# Patient Record
Sex: Male | Born: 1955
Health system: Southern US, Community
[De-identification: ages and names within clinical notes are randomized; demographics above are authoritative.]

## PROBLEM LIST (undated history)

## (undated) DIAGNOSIS — F329 Major depressive disorder, single episode, unspecified: Secondary | ICD-10-CM

## (undated) DIAGNOSIS — B1920 Unspecified viral hepatitis C without hepatic coma: Secondary | ICD-10-CM

## (undated) DIAGNOSIS — I10 Essential (primary) hypertension: Secondary | ICD-10-CM

## (undated) DIAGNOSIS — F1910 Other psychoactive substance abuse, uncomplicated: Secondary | ICD-10-CM

## (undated) DIAGNOSIS — F32A Depression, unspecified: Secondary | ICD-10-CM

## (undated) DIAGNOSIS — F419 Anxiety disorder, unspecified: Secondary | ICD-10-CM

## (undated) HISTORY — DX: Essential (primary) hypertension: I10

## (undated) HISTORY — DX: Unspecified viral hepatitis C without hepatic coma: B19.20

## (undated) HISTORY — DX: Other psychoactive substance abuse, uncomplicated: F19.10

## (undated) HISTORY — PX: APPENDECTOMY: SHX54

---

## 2018-02-11 ENCOUNTER — Emergency Department (HOSPITAL_COMMUNITY): Payer: Self-pay

## 2018-02-11 ENCOUNTER — Encounter (HOSPITAL_COMMUNITY): Payer: Self-pay | Admitting: Emergency Medicine

## 2018-02-11 ENCOUNTER — Emergency Department (HOSPITAL_COMMUNITY)
Admission: EM | Admit: 2018-02-11 | Discharge: 2018-02-11 | Disposition: A | Discharge status: Home / Self Care | Payer: Self-pay | Attending: Emergency Medicine | Admitting: Emergency Medicine

## 2018-02-11 DIAGNOSIS — R251 Tremor, unspecified: Secondary | ICD-10-CM | POA: Insufficient documentation

## 2018-02-11 DIAGNOSIS — G252 Other specified forms of tremor: Secondary | ICD-10-CM

## 2018-02-11 DIAGNOSIS — E871 Hypo-osmolality and hyponatremia: Secondary | ICD-10-CM | POA: Insufficient documentation

## 2018-02-11 DIAGNOSIS — Z79899 Other long term (current) drug therapy: Secondary | ICD-10-CM | POA: Insufficient documentation

## 2018-02-11 DIAGNOSIS — I951 Orthostatic hypotension: Secondary | ICD-10-CM | POA: Insufficient documentation

## 2018-02-11 HISTORY — DX: Depression, unspecified: F32.A

## 2018-02-11 HISTORY — DX: Anxiety disorder, unspecified: F41.9

## 2018-02-11 HISTORY — DX: Major depressive disorder, single episode, unspecified: F32.9

## 2018-02-11 LAB — URINALYSIS, ROUTINE W REFLEX MICROSCOPIC
BILIRUBIN URINE: NEGATIVE
Glucose, UA: NEGATIVE mg/dL
Hgb urine dipstick: NEGATIVE
Ketones, ur: NEGATIVE mg/dL
Nitrite: NEGATIVE
Protein, ur: NEGATIVE mg/dL
Specific Gravity, Urine: 1.006 (ref 1.005–1.030)
pH: 7 (ref 5.0–8.0)

## 2018-02-11 LAB — COMPREHENSIVE METABOLIC PANEL
ALT: 84 U/L — ABNORMAL HIGH (ref 0–44)
AST: 66 U/L — AB (ref 15–41)
Albumin: 3.7 g/dL (ref 3.5–5.0)
Alkaline Phosphatase: 76 U/L (ref 38–126)
Anion gap: 9 (ref 5–15)
BUN: 9 mg/dL (ref 8–23)
CO2: 24 mmol/L (ref 22–32)
Calcium: 9 mg/dL (ref 8.9–10.3)
Chloride: 98 mmol/L (ref 98–111)
Creatinine, Ser: 0.8 mg/dL (ref 0.61–1.24)
GFR calc Af Amer: >60 mL/min (ref >60)
GFR calc non Af Amer: >60 mL/min (ref >60)
Glucose, Bld: 106 mg/dL — ABNORMAL HIGH (ref 70–99)
Potassium: 4.4 mmol/L (ref 3.5–5.1)
Sodium: 131 mmol/L — ABNORMAL LOW (ref 135–145)
Total Bilirubin: 0.7 mg/dL (ref 0.3–1.2)
Total Protein: 6.9 g/dL (ref 6.5–8.1)

## 2018-02-11 LAB — CBC WITH DIFFERENTIAL/PLATELET
Abs Immature Granulocytes: 0.02 10*3/uL (ref 0.00–0.07)
BASOS ABS: 0 10*3/uL (ref 0.0–0.1)
Basophils Relative: 1 %
Eosinophils Absolute: 0.1 10*3/uL (ref 0.0–0.5)
Eosinophils Relative: 2 %
HEMATOCRIT: 43.6 % (ref 39.0–52.0)
Hemoglobin: 15.2 g/dL (ref 13.0–17.0)
IMMATURE GRANULOCYTES: 0 %
LYMPHS ABS: 1 10*3/uL (ref 0.7–4.0)
Lymphocytes Relative: 18 %
MCH: 32.1 pg (ref 26.0–34.0)
MCHC: 34.9 g/dL (ref 30.0–36.0)
MCV: 92 fL (ref 80.0–100.0)
Monocytes Absolute: 0.7 10*3/uL (ref 0.1–1.0)
Monocytes Relative: 13 %
NRBC: 0 % (ref 0.0–0.2)
Neutro Abs: 3.5 10*3/uL (ref 1.7–7.7)
Neutrophils Relative %: 66 %
Platelets: 275 10*3/uL (ref 150–400)
RBC: 4.74 MIL/uL (ref 4.22–5.81)
RDW: 11.8 % (ref 11.5–15.5)
WBC: 5.3 10*3/uL (ref 4.0–10.5)

## 2018-02-11 LAB — AMMONIA: Ammonia: 21 umol/L (ref 9–35)

## 2018-02-11 LAB — MAGNESIUM: Magnesium: 1.9 mg/dL (ref 1.7–2.4)

## 2018-02-11 MED ORDER — SODIUM CHLORIDE 0.9 % IV BOLUS
500.0000 mL | Freq: Once | INTRAVENOUS | Status: AC
  Administered 2018-02-11: 500 mL via INTRAVENOUS

## 2018-02-11 MED ORDER — GADOBUTROL 1 MMOL/ML IV SOLN
7.0000 mL | Freq: Once | INTRAVENOUS | Status: AC | PRN
  Administered 2018-02-11: 7 mL via INTRAVENOUS

## 2018-02-11 MED ORDER — LORAZEPAM 2 MG/ML IJ SOLN
1.0000 mg | Freq: Once | INTRAMUSCULAR | Status: AC | PRN
  Administered 2018-02-11: 1 mg via INTRAVENOUS
  Filled 2018-02-11: qty 1

## 2018-02-11 MED ORDER — MECLIZINE HCL 25 MG PO TABS
25.0000 mg | ORAL_TABLET | Freq: Once | ORAL | Status: AC
  Administered 2018-02-11: 25 mg via ORAL
  Filled 2018-02-11: qty 1

## 2018-02-11 NOTE — Progress Notes (Signed)
ED CM spoke with the patient, his brother and his sister-in-law at the bedside. The patient needs a PCP. He is currently uninsured. CM provided the patient with the contact information for [facility] and [facility]. His sister-in-law states she will call tomorrow to schedule an appointment. Patient has his family to assist him as needed. EDP recommended a walker. The patient's family states they can get a walker for him. Patient's sister-in-law reports he is able to ambulate but has difficulty when he goes up stairs.

## 2018-02-11 NOTE — ED Notes (Signed)
Pt has 2 patients in front of him in mri .  Pt onformed

## 2018-02-11 NOTE — ED Triage Notes (Addendum)
Pt arrives to ED with c/o of dizziness with multiple falls. Pt states dizziness is worse with standing up on exertion. Pt arrives with sister in law and states he has been different in effect and speech seems "hoarse" pt also reports 1 month of bilateral hand tremors.  Sister in law states pt has not been "normal" for the last 3 months.

## 2018-02-11 NOTE — ED Provider Notes (Signed)
[facility] EMERGENCY DEPARTMENT Provider Note   CSN: [id number] Arrival date & time: 02/11/18  1049     History   Chief Complaint Chief Complaint  Patient presents with  . Dizziness  . Fall    HPI Darin EngelsLawrence Silveri is a 63 y.o. male.  HPI 63 year old male with no significant past medical history other than depression/anxiety here with generalized weakness, falls, and speech changes.  History provided by patient as well as family.  Per report, he has had progressively worsening bilateral upper and lower extremity tremor for the last several months.  He has not seen a doctor for this.  Over the last week, his tremor has worsened and he has now begun to develop ataxia and difficulty walking.  He is fallen multiple times due to losing his balance.  He has also had increased hoarseness and change in his voice.  He is had generalized weakness and increased confusion, and reportedly is now getting lost driving relates that he has known for a significant amount of time.  He denies any headache.  Denies any focal numbness or weakness.  No difficulty swallowing.  No vision changes.  No fevers or chills.  No recent medication changes.  No other complaints.  He does state the dizziness is worse upon standing, but is generally persistent after standing for some time as well.   Past Medical History:  Diagnosis Date  . Anxiety   . Depression       There are no active problems to display for this patient.    History reviewed. No pertinent surgical history.      Home Medications    Prior to Admission medications   Medication Sig Start Date End Date Taking? Authorizing Provider  acetaminophen (TYLENOL) 500 MG tablet Take 500 mg by mouth every 6 (six) hours as needed for mild pain.   Yes [provider]  benztropine (COGENTIN) 0.5 MG tablet Take 0.5 mg by mouth 2 (two) times daily.   Yes [provider]  buPROPion (WELLBUTRIN SR) 150 MG 12 hr tablet Take 150  mg by mouth 2 (two) times daily.   Yes [provider]  FLUoxetine (PROZAC) 40 MG capsule Take 40 mg by mouth daily.   Yes [provider]  hydrOXYzine (VISTARIL) 25 MG capsule Take 25 mg by mouth 2 (two) times daily as needed for anxiety.   Yes [provider]  Multiple Vitamin (MULTIVITAMIN WITH MINERALS) TABS tablet Take 1 tablet by mouth daily.   Yes [provider]    Family History No family history on file.  Social History Social History   Tobacco Use  . Smoking status: Not on file  Substance Use Topics  . 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 Drug use: Not on file     Allergies   Patient has no known allergies.   Review of Systems Review of Systems  Constitutional: Positive for fatigue. Negative for chills and fever.  HENT: Negative for congestion and rhinorrhea.   Eyes: Negative for visual disturbance.  Respiratory: Negative for cough, shortness of breath and wheezing.   Cardiovascular: Negative for chest pain and leg swelling.  Gastrointestinal: Negative for abdominal pain, diarrhea, nausea and vomiting.  Genitourinary: Negative for dysuria and flank pain.  Musculoskeletal: Positive for gait problem. Negative for neck pain and neck stiffness.  Skin: Negative for rash and wound.  Allergic/Immunologic: Negative for immunocompromised state.  Neurological: Positive for dizziness,  speech difficulty and weakness. Negative for syncope and headaches.  Psychiatric/Behavioral: Positive for confusion.  All other systems reviewed and are negative.    Physical Exam Updated Vital Signs BP (!) 136/98   Pulse 68   Temp 98.6 F (37 C) (Oral)   Resp 10   SpO2 96%   Physical Exam Vitals signs and nursing note reviewed.  Constitutional:      General: He is not in acute distress.    Appearance: He is well-developed.  HENT:     Head: Normocephalic and atraumatic.     Comments: Mild hoarseness  appreciated Eyes:     Conjunctiva/sclera: Conjunctivae normal.  Neck:     Musculoskeletal: Neck supple.  Cardiovascular:     Rate and Rhythm: Normal rate and regular rhythm.     Heart sounds: Normal heart sounds. No murmur. No friction rub.  Pulmonary:     Effort: Pulmonary effort is normal. No respiratory distress.     Breath sounds: Normal breath sounds. No wheezing or rales.  Abdominal:     General: There is no distension.     Palpations: Abdomen is soft.     Tenderness: There is no abdominal tenderness.  Skin:    General: Skin is warm.     Capillary Refill: Capillary refill takes less than 2 seconds.  Neurological:     Mental Status: He is alert and oriented to person, place, and time.     Motor: No abnormal muscle tone.     Neurological Exam:  Mental Status: Alert and oriented to person, place, and time. Attention and concentration normal. Speech clear. Recent memory is intact. Cranial Nerves: Visual fields grossly intact. EOMI and PERRLA. No nystagmus noted. Facial sensation intact at forehead, maxillary cheek, and chin/mandible bilaterally. No facial asymmetry or weakness. Hearing grossly normal. Uvula is midline, and palate elevates symmetrically. Normal SCM and trapezius strength. Tongue midline without fasciculations. Motor: Muscle strength 5/5 in proximal and distal UE and LE bilaterally. No pronator drift. Muscle tone normal. Reflexes: 2+ and symmetrical in all four extremities.  Sensation: Intact to light touch in upper and lower extremities distally bilaterally.  Gait: Normal without ataxia. Coordination: Normal FTN bilaterally. Rest and intention tremor noted centrally and peripherally in b/l UE and LE.   ED Treatments / Results  Labs (all labs ordered are listed, but only abnormal results are displayed) Labs Reviewed  COMPREHENSIVE METABOLIC PANEL - Abnormal; Notable for the following components:      Result Value   Sodium 131 (*)    Glucose, Bld 106 (*)     AST 66 (*)    ALT 84 (*)    All other components within normal limits  URINALYSIS, ROUTINE W REFLEX MICROSCOPIC - Abnormal; Notable for the following components:   Leukocytes, UA SMALL (*)    Bacteria, UA FEW (*)    All other components within normal limits  CBC WITH DIFFERENTIAL/PLATELET  MAGNESIUM  AMMONIA    EKG EKG Interpretation  Date/Time:  Wednesday February 11 2018 10:57:38 EST Ventricular Rate:  72 PR Interval:    QRS Duration: 111 QT Interval:  395 QTC Calculation: 433 R Axis:   77 Text Interpretation:  Atrial fibrillation No old tracing to compare Confirmed by Shaune PollackIsaacs, Kimberly Nieland [phone]([id number]) on 02/11/2018 11:11:15 AM   Radiology Dg Chest 2 View  Result Date: 02/11/2018 CLINICAL DATA:  Multiple falls EXAM: CHEST - 2 VIEW COMPARISON:  None. FINDINGS: Cardiac shadows within normal limits. The lungs are well aerated bilaterally. No focal infiltrate  or sizable effusion is seen. No acute bony abnormality is noted. IMPRESSION: No acute abnormality noted. Electronically Signed   By: Alcide Clever M.D.   On: 02/11/2018 11:52   Ct Head Wo Contrast  Result Date: 02/11/2018 CLINICAL DATA:  Dizziness and ataxia.  Multiple falls. EXAM: CT HEAD WITHOUT CONTRAST TECHNIQUE: Contiguous axial images were obtained from the base of the skull through the vertex without intravenous contrast. COMPARISON:  None. FINDINGS: Brain: There is no mass, hemorrhage or extra-axial collection. The size and configuration of the ventricles and extra-axial CSF spaces are normal. The brain parenchyma is normal, without acute or chronic infarction. Vascular: No abnormal hyperdensity of the major intracranial arteries or dural venous sinuses. No intracranial atherosclerosis. Skull: The visualized skull base, calvarium and extracranial soft tissues are normal. Sinuses/Orbits: No fluid levels or advanced mucosal thickening of the visualized paranasal sinuses. No mastoid or middle ear effusion. The orbits are normal.  IMPRESSION: Normal aging brain. Electronically Signed   By: Deatra Robinson M.D.   On: 02/11/2018 16:09    Procedures Procedures (including critical care time)  Medications Ordered in ED Medications  LORazepam (ATIVAN) injection 1 mg (has no administration in time range)  sodium chloride 0.9 % bolus 500 mL (0 mLs Intravenous Stopped 02/11/18 1349)  sodium chloride 0.9 % bolus 500 mL (0 mLs Intravenous Stopped 02/11/18 1506)  meclizine (ANTIVERT) tablet 25 mg (25 mg Oral Given 02/11/18 1733)     Initial Impression / Assessment and Plan / ED Course  I have reviewed the triage vital signs and the nursing notes.  Pertinent labs & imaging results that were available during my care of the patient were reviewed by me and considered in my medical decision making (see chart for details).     63 yo M here with tremors, dizziness with standing and subsequent transient gait problems, as well as reported hoarseness. No h/o CVA/TIA. He appears mildly dehydrated.. Labs show hyponatremia, and exam is c/w mild dehydration w/ orthostasis. IVF given. Otherwise, will check CT. He has no dysphagia or signs of cerebellar abnormality, stroke, or mass on exam.   Suspect mild hyponatremia and dehydration, versus atypical presentation of stroke/cerebellar abnormality. IVF given. Plan is to f/u CT - if negative and pt ambualting, improved w/ fluids, would refer to PCP and advise hydration at home. If he remains tremulous and ataxic, will check MRI for occult CVA, mass/lesion.  Incidentally, pt has mild LFT elevation. Ammonia normal. H/o etoh use though now sober. No signs of cirrhosis on exam. F/u with outpt. No abd TTP.  Final Clinical Impressions(s) / ED Diagnoses   Final diagnoses:  Hyponatremia  Coarse tremors  Orthostasis    ED Discharge Orders    None       Shaune Pollack, MD 02/11/18 Jerene Bears

## 2018-02-11 NOTE — ED Notes (Signed)
To mri 

## 2018-02-11 NOTE — ED Notes (Signed)
Pt passed the swallow screen  Food given per order dr Erma Heritage

## 2018-02-11 NOTE — ED Notes (Signed)
[facility]

## 2018-02-11 NOTE — ED Notes (Signed)
Pt walked in hallway with no difficulty  He reports that he is not dizzy at present

## 2018-02-11 NOTE — ED Notes (Signed)
ED Provider at bedside. 

## 2018-02-11 NOTE — ED Provider Notes (Signed)
3:52 PM Assumed care from Dr. Erma Heritage, please see their note for full history, physical and decision making until this point. In brief this is a 63 y.o. year old male who presented to the ED tonight with Dizziness and Fall     Tremor for months. Now with gait issues/confusion. No other neuro findings. No other chronic issues.   Hydrocephalus? Metastatic? pending ct.   MRI negative. Possibly metabolic? Case management involved and will assist in outpatient follow up.   Discharge instructions, including strict return precautions for new or worsening symptoms, given. Patient and/or family verbalized understanding and agreement with the plan as described.   Labs, studies and imaging reviewed by myself and considered in medical decision making if ordered. Imaging interpreted by radiology.  Labs Reviewed  COMPREHENSIVE METABOLIC PANEL - Abnormal; Notable for the following components:      Result Value   Sodium 131 (*)    Glucose, Bld 106 (*)    AST 66 (*)    ALT 84 (*)    All other components within normal limits  URINALYSIS, ROUTINE W REFLEX MICROSCOPIC - Abnormal; Notable for the following components:   Leukocytes, UA SMALL (*)    Bacteria, UA FEW (*)    All other components within normal limits  CBC WITH DIFFERENTIAL/PLATELET  MAGNESIUM  AMMONIA    DG Chest 2 View  Final Result    CT Head Wo Contrast    (Results Pending)    No follow-ups on file.    Marily Memos, MD 02/12/18 [phone]

## 2018-02-11 NOTE — ED Notes (Signed)
Pt waiting for mri 

## 2018-02-12 ENCOUNTER — Telehealth: Payer: Self-pay

## 2018-02-12 NOTE — Telephone Encounter (Signed)
Call received from patient's sister in law, Nahum Biagini who is a Engineer, civil (consulting) at the [employer]. She explained that her husband used to be a Sports coach.  She explained that the patient has no insurance and will need follow up for his medical issues. She did state that he has followed up with [facility] in the past. She also has concerns about his competency.   She said that he does not have medicaid but has been approved for disability. She was not sure exactly how much he receives and he has been private about this information.  She did say that he said he makes less than $12000./year. This CM explained to her that he should apply for medicaid, if he is not eligible then he would need to apply for [employer] and the [employer]. Medicaid however would benefit him more. Provided her with the phone # for DSS.  She also stated that he was instructed to have his sodium checked in a week. She would like to know if this should be done before his appointment on 02/23/2018.  Informed her that the provider he will be seeing will be notified.Marland Kitchen

## 2018-02-12 NOTE — Telephone Encounter (Signed)
Message received from Rubie Maid, RN CM noting that the patient's sister in law, Severo Brian, will be calling for an appointment at [facility] for patient.     Message also received from Rona Ravens, requesting a call back.  Call returned to [facility] # [phone] and messages left on each phone with CM call back # [phone].  Update provided to C. Willeen Cass, RN CM noting that the patient has an appointment scheduled for 02/23/2018 @ 0930

## 2018-02-16 NOTE — Telephone Encounter (Signed)
Thank you. Sodium can be checked at his office visit. Not critically low.

## 2018-02-17 NOTE — Telephone Encounter (Signed)
Call placed to patient's sister in law, Kaare Weideman and informed her that as her Bertram Denver, NP, the patient's sodium can be checked at his office visit.  Vikki Ports confirmed that the visit is 02/23/2018.

## 2018-02-23 ENCOUNTER — Encounter: Payer: Self-pay | Admitting: Nurse Practitioner

## 2018-02-23 ENCOUNTER — Ambulatory Visit: Payer: Self-pay | Attending: Nurse Practitioner | Admitting: Nurse Practitioner

## 2018-02-23 VITALS — BP 121/77 | HR 67 | Temp 98.7°F | Ht 69.0 in | Wt 173.4 lb

## 2018-02-23 DIAGNOSIS — R251 Tremor, unspecified: Secondary | ICD-10-CM | POA: Insufficient documentation

## 2018-02-23 DIAGNOSIS — Z79899 Other long term (current) drug therapy: Secondary | ICD-10-CM | POA: Insufficient documentation

## 2018-02-23 DIAGNOSIS — R410 Disorientation, unspecified: Secondary | ICD-10-CM | POA: Insufficient documentation

## 2018-02-23 DIAGNOSIS — R531 Weakness: Secondary | ICD-10-CM | POA: Insufficient documentation

## 2018-02-23 DIAGNOSIS — R42 Dizziness and giddiness: Secondary | ICD-10-CM | POA: Insufficient documentation

## 2018-02-23 DIAGNOSIS — F329 Major depressive disorder, single episode, unspecified: Secondary | ICD-10-CM | POA: Insufficient documentation

## 2018-02-23 DIAGNOSIS — R945 Abnormal results of liver function studies: Secondary | ICD-10-CM

## 2018-02-23 DIAGNOSIS — R7989 Other specified abnormal findings of blood chemistry: Secondary | ICD-10-CM

## 2018-02-23 DIAGNOSIS — F419 Anxiety disorder, unspecified: Secondary | ICD-10-CM | POA: Insufficient documentation

## 2018-02-23 NOTE — Progress Notes (Addendum)
Assessment & Plan:  Tyrone BishopLawrence was seen today for hospitalization follow-up.  Diagnoses and all orders for this visit:  Lightheadedness -     TSH -     Fecal occult blood, imunochemical([facility])  Elevated LFTs -     Hepatitis C Antibody    Patient has been counseled on age-appropriate routine health concerns for screening and prevention. These are reviewed and up-to-date. Referrals have been placed accordingly. Immunizations are up-to-date or declined.    Subjective:   Chief Complaint  Patient presents with  . Hospitalization Follow-up    Pt. is here for a hospital follow-up. Pt. stated he feel disorient.   HPI Tyrone EngelsLawrence Nielsen 63 y.o. male presents to office today for follow up.  He has a history of anxiety and depression and most recently dizziness for which he was seen in the ED a few weeks ago. Additional symptoms at that time included transient gait issues and voice changes with hoarseness. MRI and CT head were negative and patient was discharged with instructions to follow up with PCP.    Dizziness Today he states he feels "I'm just not all there". Having memory issues. He endorses several episodes of falls  due to disequilibrium and weakness. Denies any loss of consciousness. Denies any bilateral weakness or paresis, sinus problems, nasal congestion, visual disturbances or stroke like symptoms. He also endorses bilateral hand tremors. He does not drink alcohol. States he recently had an increase in psych med. Cant recall which one. However he states symptoms were occurring prior to med change. No aggravating or relieving factors. He denies any stroke like symptoms. Will order additional labs today. If inconclusive will need to refer to neurology. Patient has been advised to apply for financial assistance and schedule to see our financial counselor.   Review of Systems  Constitutional: Negative for fever, malaise/fatigue and weight loss.  HENT: Negative.  Negative for  nosebleeds.   Eyes: Negative.  Negative for blurred vision, double vision and photophobia.  Respiratory: Negative.  Negative for cough and shortness of breath.   Cardiovascular: Negative.  Negative for chest pain, palpitations and leg swelling.  Gastrointestinal: Negative.  Negative for heartburn, nausea and vomiting.  Musculoskeletal: Negative.  Negative for myalgias.  Neurological: Positive for dizziness, tremors, speech change and weakness. Negative for focal weakness, seizures, loss of consciousness and headaches.  Psychiatric/Behavioral: Positive for depression. Negative for suicidal ideas. The patient is nervous/anxious.     Past Medical History:  Diagnosis Date  . Anxiety   . Depression     History reviewed. No pertinent surgical history.  Family History  Problem Relation Age of Onset  . Hypertension Neg Hx   . Intellectual disability Neg Hx     Social History Reviewed with no changes to be made today.   Outpatient Medications Prior to Visit  Medication Sig Dispense Refill  . benztropine (COGENTIN) 0.5 MG tablet Take 0.5 mg by mouth 2 (two) times daily.    Marland Kitchen. buPROPion (WELLBUTRIN SR) 150 MG 12 hr tablet Take 150 mg by mouth 2 (two) times daily.    Marland Kitchen. FLUoxetine (PROZAC) 40 MG capsule Take 40 mg by mouth daily.    . hydrOXYzine (VISTARIL) 25 MG capsule Take 25 mg by mouth 2 (two) times daily as needed for anxiety.    . Lurasidone HCl (LATUDA) 60 MG TABS Take 1 tablet by mouth every evening.    . Multiple Vitamin (MULTIVITAMIN WITH MINERALS) TABS tablet Take 1 tablet by mouth daily.    .Marland Kitchen  acetaminophen (TYLENOL) 500 MG tablet Take 500 mg by mouth every 6 (six) hours as needed for mild pain.     No facility-administered medications prior to visit.     No Known Allergies     Objective:    BP 121/77 (BP Location: Left Arm, Patient Position: Sitting, Cuff Size: Normal)   Pulse 67   Temp 98.7 F (37.1 C) (Oral)   Ht 5\' 9"  (1.753 m)   Wt 173 lb 6.4 oz (78.7 kg)   SpO2  97%   BMI 25.61 kg/m  Wt Readings from Last 3 Encounters:  02/23/18 173 lb 6.4 oz (78.7 kg)    Physical Exam Vitals signs and nursing note reviewed.  Constitutional:      Appearance: He is well-developed.  HENT:     Head: Normocephalic and atraumatic.     Comments: Bilateral impacted cerumen Neck:     Musculoskeletal: Normal range of motion.  Cardiovascular:     Rate and Rhythm: Normal rate and regular rhythm.     Heart sounds: Normal heart sounds. No murmur. No friction rub. No gallop.   Pulmonary:     Effort: Pulmonary effort is normal. No tachypnea or respiratory distress.     Breath sounds: Normal breath sounds. No decreased breath sounds, wheezing, rhonchi or rales.  Chest:     Chest wall: No tenderness.  Abdominal:     General: Bowel sounds are normal.     Palpations: Abdomen is soft.  Musculoskeletal: Normal range of motion.  Skin:    General: Skin is warm and dry.  Neurological:     Mental Status: He is alert and oriented to person, place, and time.     Sensory: Sensation is intact.     Motor: Motor function is intact.     Coordination: Coordination is intact. Coordination normal.     Gait: Gait is intact.  Psychiatric:        Behavior: Behavior normal. Behavior is cooperative.        Thought Content: Thought content normal.        Judgment: Judgment normal.        Patient has been counseled extensively about nutrition and exercise as well as the importance of adherence with medications and regular follow-up. The patient was given clear instructions to go to ER or return to medical center if symptoms don't improve, worsen or new problems develop. The patient verbalized understanding.   Follow-up: Return in about 4 weeks (around 03/23/2018) for f/u for dizziness, make appt with nurse for ear wax removal and needs Financial papers.   Claiborne Rigg, FNP-BC [facility] [place], [place] [phone]   02/27/2018, 9:58 PM

## 2018-02-24 LAB — HEPATITIS C ANTIBODY: Hep C Virus Ab: >11 s/co ratio — ABNORMAL HIGH (ref 0.0–0.9)

## 2018-02-24 LAB — TSH: TSH: 4.86 u[IU]/mL — ABNORMAL HIGH (ref 0.450–4.500)

## 2018-03-02 ENCOUNTER — Telehealth: Payer: Self-pay

## 2018-03-02 NOTE — Telephone Encounter (Signed)
-----   Message from Claiborne Rigg, NP sent at 02/27/2018 10:37 PM EST ----- Thyroid level is slightly elevated. Will repeat at your next office visit. This slight elevation would not likely cause your symptoms but will still need to be rechecked. You also tested positive for Hepatitis C antibody. Will need to have additional blood work drawn to determine if the virus is still active in your blood currently. Please have your labs drawn at your office visit on the 21st,

## 2018-03-02 NOTE — Telephone Encounter (Signed)
CMA attempt to reach patient to inform on results.  No answer and left a VM for patient to cal back.

## 2018-03-02 NOTE — Telephone Encounter (Signed)
Patient called to get their results please follow up.  °

## 2018-03-02 NOTE — Telephone Encounter (Signed)
CMA spoke to patient to inform on results and PCP advising.  Pt. Understood. Pt. Verified DOB.  

## 2018-03-04 LAB — FECAL OCCULT BLOOD, IMMUNOCHEMICAL: Fecal Occult Bld: NEGATIVE

## 2018-03-06 ENCOUNTER — Ambulatory Visit: Payer: Self-pay

## 2018-03-06 ENCOUNTER — Telehealth: Payer: Self-pay | Admitting: Emergency Medicine

## 2018-03-06 NOTE — Telephone Encounter (Signed)
Patient called and was rescheduled for ear lavage on Friday March 13, 2018 @ 1330 hrs..  Patient was apologized to for having to reschedule appointment.  Patient acknowledged rescheduling and apology.

## 2018-03-13 ENCOUNTER — Ambulatory Visit: Payer: Self-pay | Attending: Family Medicine | Admitting: *Deleted

## 2018-03-13 DIAGNOSIS — R42 Dizziness and giddiness: Secondary | ICD-10-CM

## 2018-03-13 DIAGNOSIS — R7989 Other specified abnormal findings of blood chemistry: Secondary | ICD-10-CM

## 2018-03-13 DIAGNOSIS — H6123 Impacted cerumen, bilateral: Secondary | ICD-10-CM

## 2018-03-13 DIAGNOSIS — R945 Abnormal results of liver function studies: Secondary | ICD-10-CM

## 2018-03-13 MED ORDER — NEOMYCIN-POLYMYXIN-HC 3.5-10000-1 OT SOLN: 3.0000 [drp] | Freq: Four times a day (QID) | OTIC | 0 refills | Status: AC

## 2018-03-13 NOTE — Patient Instructions (Signed)
Make appointment to follow up with PCP around March 9th Call on Monday to reschedule an appointment with Financial Counselor for [facility] benefits.  We will call your brother with your lab results as you consented to today by signing the Party Release form.  Pick up medication at your pharmacy for your ear

## 2018-03-13 NOTE — Progress Notes (Signed)
Ceruminosis is noted.  Wax is removed by elephant ear device. Mr. Tyrone Nielsen was a little sensitive to water being squirted in his right ear. [name] assessed his ear and will send a prescription to his pharmacy. Lab test done today for TSH and Hep C per the lab note on 03/02/2018.

## 2018-03-15 LAB — THYROID PANEL WITH TSH
Free Thyroxine Index: 2.2 (ref 1.2–4.9)
T3 Uptake Ratio: 17 % — ABNORMAL LOW (ref 24–39)
T4, Total: 13.2 ug/dL — ABNORMAL HIGH (ref 4.5–12.0)
TSH: 3.71 u[IU]/mL (ref 0.450–4.500)

## 2018-03-15 LAB — HCV RNA QUANT
HCV log10: 5.92 log10 IU/mL
Hepatitis C Quantitation: 832000 IU/mL

## 2018-03-16 ENCOUNTER — Other Ambulatory Visit: Payer: Self-pay | Admitting: Nurse Practitioner

## 2018-03-16 DIAGNOSIS — E871 Hypo-osmolality and hyponatremia: Secondary | ICD-10-CM

## 2018-03-16 DIAGNOSIS — R768 Other specified abnormal immunological findings in serum: Secondary | ICD-10-CM

## 2018-03-17 ENCOUNTER — Telehealth: Payer: Self-pay

## 2018-03-17 NOTE — Telephone Encounter (Signed)
CMA spoke to patient.  Patient stated he have an upcoming appt. For the financial assistance on 03/09/220 and would like to get his repeat blood work on that day.

## 2018-03-17 NOTE — Telephone Encounter (Signed)
-----   Message from Claiborne Rigg, NP sent at 03/16/2018  9:07 AM EST ----- Please have patient return for repeat Hep C lab work and to recheck sodium level. Thank you

## 2018-03-18 ENCOUNTER — Other Ambulatory Visit: Payer: Self-pay | Admitting: Nurse Practitioner

## 2018-03-18 NOTE — Telephone Encounter (Signed)
Noted. Orders placed.

## 2018-03-23 ENCOUNTER — Ambulatory Visit: Payer: Self-pay

## 2018-03-23 ENCOUNTER — Encounter: Payer: Self-pay | Admitting: Nurse Practitioner

## 2018-03-23 ENCOUNTER — Ambulatory Visit: Payer: Self-pay | Attending: Nurse Practitioner | Admitting: Nurse Practitioner

## 2018-03-23 VITALS — BP 128/83 | HR 66 | Temp 98.1°F | Ht 69.0 in | Wt 172.6 lb

## 2018-03-23 DIAGNOSIS — R42 Dizziness and giddiness: Secondary | ICD-10-CM

## 2018-03-23 DIAGNOSIS — H6121 Impacted cerumen, right ear: Secondary | ICD-10-CM

## 2018-03-23 DIAGNOSIS — R768 Other specified abnormal immunological findings in serum: Secondary | ICD-10-CM

## 2018-03-23 DIAGNOSIS — E871 Hypo-osmolality and hyponatremia: Secondary | ICD-10-CM

## 2018-03-23 MED ORDER — MECLIZINE HCL 25 MG PO TABS: 25.0000 mg | ORAL_TABLET | Freq: Three times a day (TID) | ORAL | 1 refills | Status: AC | PRN

## 2018-03-23 NOTE — Progress Notes (Signed)
Assessment & Plan:  Tyrone Nielsen was seen today for dizziness.  Diagnoses and all orders for this visit:  Dizziness -     meclizine (ANTIVERT) 25 MG tablet; Take 1 tablet (25 mg total) by mouth 3 (three) times daily as needed for up to 30 days for dizziness.  Right ear impacted cerumen  Hyponatremia -     Basic metabolic panel  Positive hepatitis C antibody test -     Hepatitis c vrs RNA detect by PCR-qual    Patient has been counseled on age-appropriate routine health concerns for screening and prevention. These are reviewed and up-to-date. Referrals have been placed accordingly. Immunizations are up-to-date or declined.    Subjective:   Chief Complaint  Patient presents with  . Dizziness    Pt. stated he get dizzy with movements.    HPI Tyrone Nielsen 63 y.o. male presents to office today with complaints of dizziness. He is accompanied by both his brother and sister today who have concerns regarding memory loss. This has been ongoing for the past few months along with his dizziness. Chest xray, MRI and Head CT negative (02-11-2018). At his last office visit with me on 02-23-2018 he had complaints of "I'm just not all there" as well as disequilibrium. He was noted for bilateral impacted cerumen and required bilateral ear irrigation. Endorsed having memory issues, bilateral hand tremors,  several episodes of falls  due to disequilibrium and weakness. Denied any loss of consciousness, BLE weakness, paresis, sinus or nasal congestion, headaches, visual disturbances or stroke like symptoms. He does not drink alcohol.   Vertigo - Dizziness: Patient presents with dizziness .  The dizziness has been present for several months. The patient describes the symptoms as disequalibirum. Symptoms are exacerbated by none identified The patient also complains of none. Patient denies aural pressure, Otalgia,otorrhea, tinnitus, hearing loss.  He has been prescribed meclizine however has not picked it up from  the pharmacy.   Previous work up has been CXRAY, CT and MRI of head. He is currently taking several psych medications including: Bupropion, cogentin, buspirone, fluoxetine and hydroxyzine. Unclear if there have been any adjustments made in his medications by psychiatry that could be contributing to his memory loss and disequilibrium. He has been instructed to follow up with [facility] regarding these concerns. He denies any stroke like symptoms.     Review of Systems  Constitutional: Negative for fever, malaise/fatigue and weight loss.  HENT: Negative.  Negative for congestion, ear discharge, ear pain, hearing loss, nosebleeds, sinus pain, sore throat and tinnitus.   Eyes: Negative.  Negative for blurred vision, double vision and photophobia.  Respiratory: Negative.  Negative for cough, shortness of breath and stridor.   Cardiovascular: Negative.  Negative for chest pain, palpitations and leg swelling.  Gastrointestinal: Negative.  Negative for heartburn, nausea and vomiting.  Musculoskeletal: Negative.  Negative for myalgias.  Neurological: Positive for dizziness. Negative for focal weakness, seizures and headaches.  Psychiatric/Behavioral: Positive for depression and memory loss. Negative for suicidal ideas. The patient is nervous/anxious.     Past Medical History:  Diagnosis Date  . Anxiety   . Depression     History reviewed. No pertinent surgical history.  Family History  Problem Relation Age of Onset  . Hypertension Neg Hx   . Intellectual disability Neg Hx     Social History Reviewed with no changes to be made today.   Outpatient Medications Prior to Visit  Medication Sig Dispense Refill  . acetaminophen (TYLENOL) 500 MG  tablet Take 500 mg by mouth every 6 (six) hours as needed for mild pain.    . benztropine (COGENTIN) 0.5 MG tablet Take 0.5 mg by mouth 2 (two) times daily.    Marland Kitchen buPROPion (WELLBUTRIN SR) 150 MG 12 hr tablet Take 150 mg by mouth 2 (two) times daily.    Marland Kitchen  FLUoxetine (PROZAC) 40 MG capsule Take 40 mg by mouth daily.    . hydrOXYzine (VISTARIL) 25 MG capsule Take 25 mg by mouth 2 (two) times daily as needed for anxiety.    . Lurasidone HCl (LATUDA) 60 MG TABS Take 1 tablet by mouth every evening.    . Multiple Vitamin (MULTIVITAMIN WITH MINERALS) TABS tablet Take 1 tablet by mouth daily.    Marland Kitchen neomycin-polymyxin-hydrocortisone (CORTISPORIN) OTIC solution Place 3 drops into the right ear 4 (four) times daily for 14 days. 10 mL 0  . busPIRone (BUSPAR) 10 MG tablet Take 10 mg by mouth 2 (two) times daily.     No facility-administered medications prior to visit.     No Known Allergies     Objective:    BP 128/83 (BP Location: Left Arm, Patient Position: Sitting, Cuff Size: Normal)   Pulse 66   Temp 98.1 F (36.7 C) (Oral)   Ht 5\' 9"  (1.753 m)   Wt 172 lb 9.6 oz (78.3 kg)   SpO2 94%   BMI 25.49 kg/m  Wt Readings from Last 3 Encounters:  03/23/18 172 lb 9.6 oz (78.3 kg)  02/23/18 173 lb 6.4 oz (78.7 kg)    Physical Exam Vitals signs and nursing note reviewed.  Constitutional:      Appearance: He is well-developed.  HENT:     Head: Normocephalic and atraumatic.     Right Ear: Hearing and external ear normal. There is impacted cerumen (unsuccessful ear irrigation today).     Left Ear: Hearing, tympanic membrane, ear canal and external ear normal.     Ears:     Comments: Unable to visualize right TM due to cerumen impaction Neck:     Musculoskeletal: Normal range of motion.  Cardiovascular:     Rate and Rhythm: Normal rate and regular rhythm.     Heart sounds: Normal heart sounds. No murmur. No friction rub. No gallop.   Pulmonary:     Effort: Pulmonary effort is normal. No tachypnea or respiratory distress.     Breath sounds: Normal breath sounds. No decreased breath sounds, wheezing, rhonchi or rales.  Chest:     Chest wall: No tenderness.  Abdominal:     General: Bowel sounds are normal.     Palpations: Abdomen is soft.    Musculoskeletal: Normal range of motion.  Skin:    General: Skin is warm and dry.  Neurological:     Mental Status: He is alert and oriented to person, place, and time.     Sensory: Sensation is intact.     Motor: Motor function is intact.     Coordination: Coordination is intact. Coordination normal.     Gait: Gait is intact.  Psychiatric:        Behavior: Behavior normal. Behavior is cooperative.        Thought Content: Thought content normal.        Judgment: Judgment normal.        Patient has been counseled extensively about nutrition and exercise as well as the importance of adherence with medications and regular follow-up. The patient was given clear instructions to go to ER or  return to medical center if symptoms don't improve, worsen or new problems develop. The patient verbalized understanding.   Follow-up: Return in about 6 weeks (around 05/04/2018) for dizziness and scheudule nurse visit for right ear irrigating seperate visit.   Claiborne Rigg, FNP-BC [facility] and [facility] [place], [place] [phone]   03/23/2018, 11:00 AM

## 2018-03-25 ENCOUNTER — Ambulatory Visit: Payer: Self-pay | Admitting: Family Medicine

## 2018-03-25 ENCOUNTER — Ambulatory Visit: Payer: Self-pay | Admitting: Nurse Practitioner

## 2018-03-25 ENCOUNTER — Telehealth: Payer: Self-pay | Admitting: Nurse Practitioner

## 2018-03-25 NOTE — Telephone Encounter (Signed)
Called Patients brother in regards to his CAFA application, he is aware that pt is only eligible for the [facility] and CAFA. He will receive the final decision from the hospital in 3-4 weeks stating the disount applied to the account.He had no further question or concerns.

## 2018-03-26 ENCOUNTER — Other Ambulatory Visit: Payer: Self-pay | Admitting: Nurse Practitioner

## 2018-03-26 DIAGNOSIS — R768 Other specified abnormal immunological findings in serum: Secondary | ICD-10-CM

## 2018-03-26 LAB — BASIC METABOLIC PANEL
BUN/Creatinine Ratio: 16 (ref 10–24)
BUN: 14 mg/dL (ref 8–27)
CO2: 25 mmol/L (ref 20–29)
CREATININE: 0.89 mg/dL (ref 0.76–1.27)
Calcium: 9.5 mg/dL (ref 8.6–10.2)
Chloride: 94 mmol/L — ABNORMAL LOW (ref 96–106)
GFR calc non Af Amer: 92 mL/min/{1.73_m2} (ref >59)
GFR, EST AFRICAN AMERICAN: 106 mL/min/{1.73_m2} (ref >59)
Glucose: 85 mg/dL (ref 65–99)
Potassium: 4.8 mmol/L (ref 3.5–5.2)
Sodium: 134 mmol/L (ref 134–144)

## 2018-03-26 LAB — HEPATITIS C VRS RNA DETECT BY PCR-QUAL: HCV RNA NAA Qualitative: POSITIVE — AB

## 2018-03-27 ENCOUNTER — Encounter: Payer: Self-pay | Admitting: Nurse Practitioner

## 2018-03-30 NOTE — Progress Notes (Signed)
Noted! Thank you

## 2018-05-04 ENCOUNTER — Encounter: Payer: Self-pay | Admitting: Nurse Practitioner

## 2018-05-04 ENCOUNTER — Ambulatory Visit: Payer: Self-pay | Attending: Nurse Practitioner | Admitting: Nurse Practitioner

## 2018-05-04 ENCOUNTER — Other Ambulatory Visit: Payer: Self-pay

## 2018-05-04 DIAGNOSIS — F419 Anxiety disorder, unspecified: Secondary | ICD-10-CM

## 2018-05-04 DIAGNOSIS — B182 Chronic viral hepatitis C: Secondary | ICD-10-CM

## 2018-05-04 DIAGNOSIS — F329 Major depressive disorder, single episode, unspecified: Secondary | ICD-10-CM

## 2018-05-04 NOTE — Progress Notes (Signed)
Virtual Visit via Telephone Note  I connected with Tyrone Nielsen on 05/04/18  at   3:22 PM EDT  EDT by telephone and verified that I am speaking with the correct person using two identifiers.   Consent I discussed the limitations, risks, security and privacy concerns of performing an evaluation and management service by telephone and the availability of in person appointments. I also discussed with the patient that there may be a patient responsible charge related to this service. The patient expressed understanding and agreed to proceed.   Location of Patient: Private Residence   Location of Provider: [facility]    Persons participating in Telemedicine visit: Bertram Denver FNP-BC YY Cathay CMA Tyrone Nielsen    History of Present Illness: Recently diagnosed with hepatitis C and has an appointment coming up with infectious disease in 2 days.   Telemedicine appointment today was for follow-up to anxiety and depression.  He also has a history of bipolar disorder.  Currently being followed by [facility] behavioral health services and taking Latuda 60 mg daily and Wellbutrin SR 150 mg twice daily.  He had experienced some dizziness and disorientation in the past however since then was taken off several antipsychotics and reports resolution of dizziness at this time. He has no concerns or complaints today and is taking all medications as prescribed.  Currently denies any thoughts of self-harm.    Observations/Objective: Awake, alert and oriented x3 General blood   Assessment and Plan: Tyrone Nielsen was seen today for follow-up.  Diagnoses and all orders for this visit:  Anxiety and depression Continue medications as prescribed as well as as routine follow-up with [facility].  Chronic hepatitis C without hepatic coma [facility]) Patient was instructed to keep appointment with infectious disease on Tuesday, 05/06/2018.     Follow Up  Instructions Return in about 3 months (around 08/03/2018).     I discussed the assessment and treatment plan with the patient. The patient was provided an opportunity to ask questions and all were answered. The patient agreed with the plan and demonstrated an understanding of the instructions.   The patient was advised to call back or seek an in-person evaluation if the symptoms worsen or if the condition fails to improve as anticipated.  I provided 17 minutes of non-face-to-face time during this encounter including median intraservice time, reviewing previous notes, labs, imaging, medications and explaining diagnosis and management.  Claiborne Rigg, FNP-BC

## 2018-05-06 ENCOUNTER — Other Ambulatory Visit: Payer: Self-pay

## 2018-05-06 ENCOUNTER — Encounter: Payer: Self-pay | Admitting: Family

## 2018-05-06 ENCOUNTER — Ambulatory Visit (INDEPENDENT_AMBULATORY_CARE_PROVIDER_SITE_OTHER): Payer: Self-pay | Admitting: Family

## 2018-05-06 ENCOUNTER — Telehealth: Payer: Self-pay | Admitting: Pharmacy Technician

## 2018-05-06 VITALS — BP 154/94 | HR 71 | Temp 98.3°F | Wt 171.0 lb

## 2018-05-06 DIAGNOSIS — B182 Chronic viral hepatitis C: Secondary | ICD-10-CM | POA: Insufficient documentation

## 2018-05-06 NOTE — Progress Notes (Signed)
Subjective:    Patient ID: Tyrone Nielsen, male    DOB: 05-06-1955, 63 y.o.   MRN: [id number]  Chief Complaint  Patient presents with  . Hepatitis C    HPI:  Tyrone Nielsen is a 63 y.o. male with history of depression and anxiety who presents today at the request of his PCP for evaluation and treatment of Hepatitis C.   Tyrone Nielsen was recently evaluated in his primary care office for post-hospitalization follow up and noted to have elevated LFT's with blood testing having a positive Hepatitis C antibody test. Confirmatory HCV RNA level of 832,000 on 03/13/2018. Risk factors for acquiring Hepatitis C includes previous history of intravenous drug use.  Denies tattoos, sharing of razors/toothbrushes, blood transfusions prior to 1992, or sexual contact with a positive partner.  Tyrone Nielsen is treatment nave.  No family or personal history of liver disease in the past.  Currently asymptomatic with no abdominal pain, scleral icterus, changes in skin color or ease of bleeding.  Has been sober for several years now and recently quit smoking approximately 6 months ago.  No Known Allergies    Outpatient Medications Prior to Visit  Medication Sig Dispense Refill  . acetaminophen (TYLENOL) 500 MG tablet Take 500 mg by mouth every 6 (six) hours as needed for mild pain.    Marland Kitchen. buPROPion (WELLBUTRIN SR) 150 MG 12 hr tablet Take 150 mg by mouth 2 (two) times daily.    . Lurasidone HCl (LATUDA) 60 MG TABS Take 1 tablet by mouth every evening.    . Multiple Vitamin (MULTIVITAMIN WITH MINERALS) TABS tablet Take 1 tablet by mouth daily.     No facility-administered medications prior to visit.      Past Medical History:  Diagnosis Date  . Anxiety   . Depression   . Hepatitis C       History reviewed. No pertinent surgical history.    Family History  Problem Relation Age of Onset  . Hypertension Neg Hx   . Intellectual disability Neg Hx       Social History   Socioeconomic History  . Marital  status: Divorced    Spouse name: Not on file  . Number of children: 1  . Years of education: 812  . Highest education level: Not on file  Occupational History  . Occupation: Retired    Comment: Event organiserContstruction  Social Needs  . Financial resource strain: Not on file  . Food insecurity:    Worry: Not on file    Inability: Not on file  . Transportation needs:    Medical: Not on file    Non-medical: Not on file  Tobacco Use  . Smoking status: Former Games developermoker  . Smokeless tobacco: Never Used  Substance and Sexual Activity  . 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 Drug use: Not Currently  . Sexual activity: Not Currently  Lifestyle  . Physical activity:    Days per week: Not on file    Minutes per session: Not on file  . Stress: Not on file  Relationships  . Social connections:    Talks on phone: Not on file    Gets together: Not on file    Attends religious service: Not on file    Active member of club or organization: Not on file    Attends meetings of clubs or organizations: Not on file    Relationship status: Not on file  . Intimate partner  violence:    Fear of current or ex partner: Not on file    Emotionally abused: Not on file    Physically abused: Not on file    Forced sexual activity: Not on file  Other Topics Concern  . Not on file  Social History Narrative  . Not on file      Review of Systems  Constitutional: Negative for chills, diaphoresis, fatigue and fever.  Respiratory: Negative for cough, chest tightness, shortness of breath and wheezing.   Cardiovascular: Negative for chest pain.  Gastrointestinal: Negative for abdominal distention, abdominal pain, constipation, diarrhea, nausea and vomiting.  Neurological: Negative for weakness and headaches.  Hematological: Does not bruise/bleed easily.       Objective:    BP (!) 154/94   Pulse 71   Temp 98.3 F (36.8 C)   Wt 171 lb (77.6 kg)   BMI 25.25 kg/m   Nursing note and vital signs reviewed.  Physical Exam Constitutional:      General: Tyrone Nielsen is not in acute distress.    Appearance: Tyrone Nielsen is well-developed.     Comments: Seated in a chair; pleasant  Cardiovascular:     Rate and Rhythm: Normal rate and regular rhythm.     Heart sounds: Normal heart sounds. No murmur. No friction rub. No gallop.   Pulmonary:     Effort: Pulmonary effort is normal. No respiratory distress.     Breath sounds: Normal breath sounds. No wheezing or rales.  Chest:     Chest wall: No tenderness.  Abdominal:     General: Bowel sounds are normal. There is no distension.     Palpations: Abdomen is soft. There is no mass.     Tenderness: There is no abdominal tenderness. There is no guarding or rebound.  Skin:    General: Skin is warm and dry.  Neurological:     Mental Status: Tyrone Nielsen is alert and oriented to person, place, and time.  Psychiatric:        Behavior: Behavior normal.        Thought Content: Thought content normal.        Judgment: Judgment normal.         Assessment & Plan:   Problem List Items Addressed This Visit      Digestive   Chronic hepatitis C without hepatic coma (HCC) - Primary    Tyrone Nielsen has chronic hepatitis C with a viral load of 832,000 and likely the result of previous history of IV drug use.  Currently asymptomatic.  Discussed the pathogenesis, risks of left untreated, and treatment options for hepatitis C.  Will obtain blood work today to check genotype and fibrosis score.  Tyrone Nielsen is uninsured with financial paperwork being completed as well as laboratory assistance being sought.  Discussed importance of preventing transmission and avoiding sharing razors or toothbrushes.  We will plan treatment with Mavyret pending blood work results.  Pharmacy staff will be in contact to follow-up.      Relevant Orders   CBC   Liver Fibrosis, FibroTest-ActiTest   Hepatitis C genotype   COMPLETE METABOLIC PANEL WITH GFR       I am having  Tyrone Engels maintain his buPROPion, multivitamin with minerals, acetaminophen, and Lurasidone HCl.   Follow-up: Return in about 1 month (around 06/05/2018), or if symptoms worsen or fail to improve.    Marcos Eke, MSN, FNP-C Nurse Practitioner [facility] Office phone: [phone] Pager: [phone] [facility] Main  number: [phone]

## 2018-05-06 NOTE — Telephone Encounter (Signed)
[facility] Patient Advocate Encounter ° °Insurance verification completed.   ° °The patient is uninsured and will need patient assistance for medication. ° °We can complete the application and will need to meet with the patient for signatures and income documentation. ° °Athaliah Baumbach E. Rayne Cowdrey, CPhT °Specialty Pharmacy Patient Advocate °[facility] °Phone: [phone] °Fax:  [phone] ° ° °

## 2018-05-06 NOTE — Assessment & Plan Note (Signed)
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  Currently asymptomatic.  Discussed the pathogenesis, risks of left untreated, and treatment options for hepatitis C.  Will obtain blood work today to check genotype and fibrosis score.  He is uninsured with financial paperwork being completed as well as laboratory assistance being sought.  Discussed importance of preventing transmission and avoiding sharing razors or toothbrushes.  We will plan treatment with Mavyret pending blood work results.  Pharmacy staff will be in contact to follow-up.

## 2018-05-06 NOTE — Patient Instructions (Signed)
Nice to meet you.  We will check your blood work today and call with the results.  Limit acetaminophen (Tylenol) usage to no more than 2 grams (2,000 mg) per day.  Avoid alcohol.  Do not share toothbrushes or razors.  Practice safe sex to protect against transmission as well as sexually transmitted disease.    Hepatitis C Hepatitis C is a viral infection of the liver. It can lead to scarring of the liver (cirrhosis), liver failure, or liver cancer. Hepatitis C may go undetected for months or years because people with the infection may not have symptoms, or they may have only mild symptoms. What are the causes? This condition is caused by the hepatitis C virus (HCV). The virus can spread from person to person (is contagious) through:  Blood.  Childbirth. A woman who has hepatitis C can pass it to her baby during birth.  Bodily fluids, such as breast milk, tears, semen, vaginal fluids, and saliva.  Blood transfusions or organ transplants done in the [place] before 1992.  What increases the risk? The following factors may make you more likely to develop this condition:  Having contact with unclean (contaminated) needles or syringes. This may result from: ? Acupuncture. ? Tattoing. ? Body piercing. ? Injecting drugs.  Having unprotected sex with someone who is infected.  Needing treatment to filter your blood (kidney dialysis).  Having HIV (human immunodeficiency virus) or AIDS (acquired immunodeficiency syndrome).  Working in a job that involves contact with blood or bodily fluids, such as health care.  What are the signs or symptoms? Symptoms of this condition include:  Fatigue.  Loss of appetite.  Nausea.  Vomiting.  Abdominal pain.  Dark yellow urine.  Yellowish skin and eyes (jaundice).  Itchy skin.  Clay-colored bowel movements.  Joint pain.  Bleeding and bruising easily.  Fluid building up in your stomach (ascites).  In some cases, you  may not have any symptoms. How is this diagnosed? This condition is diagnosed with:  Blood tests.  Other tests to check how well your liver is functioning. They may include: ? Magnetic resonance elastography (MRE). This imaging test uses MRIs and sound waves to measure liver stiffness. ? Transient elastography. This imaging test uses ultrasounds to measure liver stiffness. ? Liver biopsy. This test requires taking a small tissue sample from your liver to examine it under a microscope.  How is this treated? Your health care provider may perform noninvasive tests or a liver biopsy to help decide the best course of treatment. Treatment may include:  Antiviral medicines and other medicines.  Follow-up treatments every 6-12 months for infections or other liver conditions.  Receiving a donated liver (liver transplant).  Follow these instructions at home: Medicines  Take over-the-counter and prescription medicines only as told by your health care provider.  Take your antiviral medicine as told by your health care provider. Do not stop taking the antiviral even if you start to feel better.  Do not take any medicines unless approved by your health care provider, including over-the-counter medicines and birth control pills. Activity  Rest as needed.  Do not have sex unless approved by your health care provider.  Ask your health care provider when you may return to school or work. Eating and drinking  Eat a balanced diet with plenty of fruits and vegetables, whole grains, and lowfat (lean) meats or non-meat proteins (such as beans or tofu).  Drink enough fluids to keep your urine clear or pale yellow.  Do  not drink alcohol. General instructions  Do not share toothbrushes, nail clippers, or razors.  Wash your hands frequently with soap and water. If soap and water are not available, use hand sanitizer.  Cover any cuts or open sores on your skin to prevent spreading the virus.   Keep all follow-up visits as told by your health care provider. This is important. You may need follow-up visits every 6-12 months. How is this prevented? There is no vaccine for hepatitis C. The only way to prevent the disease is to reduce the risk of exposure to the virus. Make sure you:  Wash your hands frequently with soap and water. If soap and water are not available, use hand sanitizer.  Do not share needles or syringes.  Practice safe sex and use condoms.  Avoid handling blood or bodily fluids without gloves or other protection.  Avoid getting tattoos or piercings in shops or other locations that are not clean.  Contact a health care provider if:  You have a fever.  You develop abdominal pain.  You pass dark urine.  You pass clay-colored stools.  You develop joint pain. Get help right away if:  You have increasing fatigue or weakness.  You lose your appetite.  You cannot eat or drink without vomiting.  You develop jaundice or your jaundice gets worse.  You bruise or bleed easily. Summary  Hepatitis C is a viral infection of the liver. It can lead to scarring of the liver (cirrhosis), liver failure, or liver cancer.  The hepatitis C virus (HCV) causes this condition. The virus can pass from person to person (is contagious).  You should not take any medicines unless approved by your health care provider. This includes over-the-counter medicines and birth control pills. This information is not intended to replace advice given to you by your health care provider. Make sure you discuss any questions you have with your health care provider. Document Released: 12/29/1999 Document Revised: 02/06/2016 Document Reviewed: 02/06/2016 Elsevier Interactive Patient Education  [employer].

## 2018-05-11 LAB — LIVER FIBROSIS, FIBROTEST-ACTITEST
ALT: 87 U/L — ABNORMAL HIGH (ref 9–46)
Alpha-2-Macroglobulin: 398 mg/dL — ABNORMAL HIGH (ref 106–279)
Apolipoprotein A1: 138 mg/dL (ref 94–176)
Bilirubin: 0.5 mg/dL (ref 0.2–1.2)
Fibrosis Score: 0.72
GGT: 61 U/L (ref 3–70)
Haptoglobin: 116 mg/dL (ref 43–212)
Necroinflammat ACT Score: 0.66
Reference ID: 2925768

## 2018-05-11 LAB — COMPLETE METABOLIC PANEL WITH GFR
AG Ratio: 1.4 (calc) (ref 1.0–2.5)
ALT: 89 U/L — ABNORMAL HIGH (ref 9–46)
AST: 54 U/L — ABNORMAL HIGH (ref 10–35)
Albumin: 4 g/dL (ref 3.6–5.1)
Alkaline phosphatase (APISO): 90 U/L (ref 35–144)
BUN: 11 mg/dL (ref 7–25)
CO2: 30 mmol/L (ref 20–32)
Calcium: 9.6 mg/dL (ref 8.6–10.3)
Chloride: 98 mmol/L (ref 98–110)
Creat: 0.96 mg/dL (ref 0.70–1.25)
GFR, Est African American: 98 mL/min/{1.73_m2} (ref >=60)
GFR, Est Non African American: 84 mL/min/{1.73_m2} (ref >=60)
Globulin: 2.8 g/dL (calc) (ref 1.9–3.7)
Glucose, Bld: 72 mg/dL (ref 65–99)
Potassium: 4.5 mmol/L (ref 3.5–5.3)
Sodium: 136 mmol/L (ref 135–146)
Total Bilirubin: 0.5 mg/dL (ref 0.2–1.2)
Total Protein: 6.8 g/dL (ref 6.1–8.1)

## 2018-05-11 LAB — CBC
HCT: 42.2 % (ref 38.5–50.0)
Hemoglobin: 15.2 g/dL (ref 13.2–17.1)
MCH: 33.1 pg — ABNORMAL HIGH (ref 27.0–33.0)
MCHC: 36 g/dL (ref 32.0–36.0)
MCV: 91.9 fL (ref 80.0–100.0)
MPV: 9.8 fL (ref 7.5–12.5)
Platelets: 276 10*3/uL (ref 140–400)
RBC: 4.59 10*6/uL (ref 4.20–5.80)
RDW: 12.3 % (ref 11.0–15.0)
WBC: 6 10*3/uL (ref 3.8–10.8)

## 2018-05-11 LAB — HEPATITIS C GENOTYPE

## 2018-05-12 ENCOUNTER — Telehealth: Payer: Self-pay | Admitting: Pharmacy Technician

## 2018-05-12 NOTE — Telephone Encounter (Signed)
[facility] Patient Advocate Encounter  I left a voicemail message for Mr. Lumpp to call me back.  I will need to know the number of people in the household and household income so that I can get him approved for Mavyret through patient assistance.  Netty Starring. Dimas Aguas CPhT Specialty Pharmacy Patient [facility] for [facility] Phone: [phone] Fax:  [phone]

## 2018-05-13 NOTE — Telephone Encounter (Signed)
[facility] Patient Advocate Encounter  Completed and sent [employer] application  for this patient who is uninsured.    Patient assistance phone number for follow up is [phone].   He has been approved for 8 weeks of Mavyret.  They will call him to set up delivery to his home.  I will follow up for his start date and follow up appointment at the clinic.  Netty Starring. Dimas Aguas CPhT Specialty Pharmacy Patient [facility] Phone: [phone] Fax:  [phone]

## 2018-05-14 ENCOUNTER — Telehealth: Payer: Self-pay | Admitting: Pharmacist

## 2018-05-14 ENCOUNTER — Encounter: Payer: Self-pay | Admitting: Pharmacy Technician

## 2018-05-14 DIAGNOSIS — B182 Chronic viral hepatitis C: Secondary | ICD-10-CM

## 2018-05-14 MED ORDER — GLECAPREVIR-PIBRENTASVIR 100-40 MG PO TABS: 3.0000 | ORAL_TABLET | Freq: Every day | ORAL | 1 refills | Status: DC

## 2018-05-14 NOTE — Telephone Encounter (Signed)
Great, thanks

## 2018-05-14 NOTE — Telephone Encounter (Signed)
Patient is approved to receive Mavyret x 8 weeks for chronic Hepatitis C infection. Counseled patient to take all three tablets of Mavyret daily with food.  Counseled patient the need to take all three tablets together and to not separate them out during the day. Encouraged patient not to miss any doses and explained how their chance of cure could go down with each dose missed. Counseled patient on what to do if dose is missed - if it is closer to the missed dose take immediately; if closer to next dose then skip dose and take the next dose at the usual time.   Counseled patient on common side effects such as headache, fatigue, and nausea and that these normally decrease with time. I reviewed patient medications and found no drug interactions. Discussed with patient that there are several drug interactions with Mavyret and instructed patient to call the clinic if he wishes to start a new medication during course of therapy. Also advised patient to call if he experiences any side effects. Patient will follow-up with me in the pharmacy clinic on 6/8.

## 2018-06-22 ENCOUNTER — Ambulatory Visit: Payer: Self-pay | Admitting: Pharmacist

## 2018-06-23 ENCOUNTER — Telehealth: Payer: Self-pay | Admitting: Pharmacist

## 2018-06-23 NOTE — Telephone Encounter (Signed)
COVID-19 Pre-Screening Questions: ° °Do you currently have a fever (>100 °F), chills or unexplained body aches? No  °Are you currently experiencing new cough, shortness of breath, sore throat, runny nose?no  °•  °Have you recently travelled outside the state of [place] in the last 14 days?no  °•  °Have you been in contact with someone that is currently pending confirmation of Covid19 testing or has been confirmed to have the Covid19 virus?  No  °

## 2018-06-24 ENCOUNTER — Ambulatory Visit (INDEPENDENT_AMBULATORY_CARE_PROVIDER_SITE_OTHER): Payer: Self-pay | Admitting: Pharmacist

## 2018-06-24 ENCOUNTER — Other Ambulatory Visit: Payer: Self-pay

## 2018-06-24 DIAGNOSIS — B182 Chronic viral hepatitis C: Secondary | ICD-10-CM

## 2018-06-24 NOTE — Progress Notes (Signed)
HPI: Tyrone Nielsen is a 63 y.o. male who [facility] for [facility] C follow-up.  Medication: Mavyret x 8 weeks  Start Date: 05/18/2018  Hepatitis C Genotype: 1a  Fibrosis Score: F3  Hepatitis C RNA: 832,000 on 03/13/2018  Patient Active Problem List   Diagnosis Date Noted   Chronic hepatitis C without hepatic coma (HCC) 05/06/2018    Patient's Medications  New Prescriptions   No medications on file  Previous Medications   ACETAMINOPHEN (TYLENOL) 500 MG TABLET    Take 500 mg by mouth every 6 (six) hours as needed for mild pain.   BUPROPION (WELLBUTRIN SR) 150 MG 12 HR TABLET    Take 150 mg by mouth 2 (two) times daily.   GLECAPREVIR-PIBRENTASVIR (MAVYRET) 100-40 MG TABS    Take 3 tablets by mouth daily with breakfast.   LURASIDONE HCL (LATUDA) 60 MG TABS    Take 1 tablet by mouth every evening.   MULTIPLE VITAMIN (MULTIVITAMIN WITH MINERALS) TABS TABLET    Take 1 tablet by mouth daily.  Modified Medications   No medications on file  Discontinued Medications   No medications on file    Allergies: No Known Allergies  Past Medical History: Past Medical History:  Diagnosis Date   Anxiety    Depression    Hepatitis C     Social History: Social History   Socioeconomic History   Marital status: Divorced    Spouse name: Not on file   Number of children: 1   Years of education: 12   Highest education level: Not on file  Occupational History   Occupation: Retired    Comment: OrthoptistContstruction  Social Needs   Financial resource strain: Not on file   Food insecurity:    Worry: Not on file    Inability: Not on file   Transportation needs:    Medical: Not on file    Non-medical: Not on file  Tobacco Use   Smoking status: Former Smoker   Smokeless tobacco: Never Used  Substance and Sexual Activity   Alcohol use: Not Currently    Comment: Prior heavy alcohol use, 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   Drug use: Not Currently   Sexual activity:  Not Currently  Lifestyle   Physical activity:    Days per week: Not on file    Minutes per session: Not on file   Stress: Not on file  Relationships   Social connections:    Talks on phone: Not on file    Gets together: Not on file    Attends religious service: Not on file    Active member of club or organization: Not on file    Attends meetings of clubs or organizations: Not on file    Relationship status: Not on file  Other Topics Concern   Not on file  Social History Narrative   Not on file    Labs: Hepatitis C Lab Results  Component Value Date   HCVGENOTYPE 1a 05/06/2018   FIBROSTAGE F3 05/06/2018   Hepatitis B No results found for: HEPBSAB, HEPBSAG, HEPBCAB Hepatitis A No results found for: HAV HIV No results found for: HIV Lab Results  Component Value Date   CREATININE 0.96 05/06/2018   CREATININE 0.89 03/23/2018   CREATININE 0.80 02/11/2018   Lab Results  Component Value Date   AST 54 (H) 05/06/2018   AST 66 (H) 02/11/2018   ALT 87 (H) 05/06/2018   ALT 89 (H) 05/06/2018   ALT 84 (H)  02/11/2018    Assessment: Tyrone Nielsen is here today to follow-up for his chronic Hepatitis C infection.  He started Mavyret x 8 weeks back on 5/4 and just started his 6th week of treatment.  He takes all three pills together every evening with dinner. No side effects or toleration issues. No missed doses to date.  Congratulated him on his perfect adherence and encouraged continued compliance.  Will check a Hep C RNA and CMET today and have him come back for SVR12 visit in 3.5 months.  Will defer EOT labs due to him almost being done with treatment in 2 weeks. Of note, he filled out a [facility] lab assistance form today.   Plan:  - Continue Mavyret x 8 weeks - Hep C RNA and CMET today - F/u with me again 9/28 at 145pm  Shikara Mcauliffe L. Arlis Everly, PharmD, BCIDP, AAHIVP, [place] [facility] 06/24/2018, 10:35 AM

## 2018-07-02 LAB — COMPREHENSIVE METABOLIC PANEL
AG Ratio: 1.4 (calc) (ref 1.0–2.5)
ALT: 19 U/L (ref 9–46)
AST: 17 U/L (ref 10–35)
Albumin: 4.3 g/dL (ref 3.6–5.1)
Alkaline phosphatase (APISO): 97 U/L (ref 35–144)
BUN: 10 mg/dL (ref 7–25)
CO2: 29 mmol/L (ref 20–32)
Calcium: 9.8 mg/dL (ref 8.6–10.3)
Chloride: 99 mmol/L (ref 98–110)
Creat: 1.1 mg/dL (ref 0.70–1.25)
Globulin: 3 g/dL (calc) (ref 1.9–3.7)
Glucose, Bld: 73 mg/dL (ref 65–99)
Potassium: 4.4 mmol/L (ref 3.5–5.3)
Sodium: 135 mmol/L (ref 135–146)
Total Bilirubin: 0.5 mg/dL (ref 0.2–1.2)
Total Protein: 7.3 g/dL (ref 6.1–8.1)

## 2018-07-02 LAB — HEPATITIS C RNA QUANTITATIVE
HCV Quantitative Log: <1.18 Log IU/mL
HCV RNA, PCR, QN: <15 IU/mL

## 2018-07-20 ENCOUNTER — Encounter: Payer: Self-pay | Admitting: Nurse Practitioner

## 2018-07-20 ENCOUNTER — Telehealth (HOSPITAL_BASED_OUTPATIENT_CLINIC_OR_DEPARTMENT_OTHER): Payer: Self-pay | Admitting: Nurse Practitioner

## 2018-07-20 DIAGNOSIS — F419 Anxiety disorder, unspecified: Secondary | ICD-10-CM

## 2018-07-20 DIAGNOSIS — F329 Major depressive disorder, single episode, unspecified: Secondary | ICD-10-CM

## 2018-07-20 DIAGNOSIS — B182 Chronic viral hepatitis C: Secondary | ICD-10-CM

## 2018-07-20 DIAGNOSIS — F32A Depression, unspecified: Secondary | ICD-10-CM

## 2018-07-20 NOTE — Progress Notes (Signed)
Virtual Visit via Telephone Note Due to national recommendations of social distancing due to [place] 19, telehealth visit is felt to be most appropriate for this patient at this time.  I discussed the limitations, risks, security and privacy concerns of performing an evaluation and management service by telephone and the availability of in person appointments. I also discussed with the patient that there may be a patient responsible charge related to this service. The patient expressed understanding and agreed to proceed.    I connected with Rhona Raider on 07/20/18  at   9:10 AM EDT  EDT by telephone and verified that I am speaking with the correct person using two identifiers.   Consent I discussed the limitations, risks, security and privacy concerns of performing an evaluation and management service by telephone and the availability of in person appointments. I also discussed with the patient that there may be a patient responsible charge related to this service. The patient expressed understanding and agreed to proceed.   Location of Patient: Private  Residence   Location of Provider: [place] and [place] participating in Telemedicine visit: Geryl Rankins FNP-BC [place]    History of Present Illness: Telemedicine visit for: Follow up.   has a past medical history of Anxiety, Depression, and Hepatitis C.   Mood Disorder He has stopped taking all of his medications (Latuda/Wellbutrin). Has not notified [facility] regarding his discontinuing of medications. He states "they weren't working anyway". I have instructed him to contact his psychiatrist/therapist at [facility] regarding his decision.  He denies any current thoughts to self harm, AH or VH.   HEP C Followed by ID. Most recent follow up on 06-24-2018. He endorses to me medication adherence taking Mavyret as prescribed.       Past Medical History:  Diagnosis  Date  . Anxiety   . Depression   . Hepatitis C     No past surgical history on file.  Family History  Problem Relation Age of Onset  . Hypertension Neg Hx   . Intellectual disability Neg Hx     Social History   Socioeconomic History  . Marital status: Divorced    Spouse name: Not on file  . Number of children: 1  . Years of education: 24  . Highest education level: Not on file  Occupational History  . Occupation: Retired    Comment: Runner, broadcasting/film/video  . Financial resource strain: Not on file  . Food insecurity    Worry: Not on file    Inability: Not on file  . Transportation needs    Medical: Not on file    Non-medical: Not on file  Tobacco Use  . Smoking status: Former Research scientist (life sciences)  . Smokeless tobacco: Never Used  Substance and Sexual Activity  . 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 Drug use: Not Currently  . Sexual activity: Not Currently  Lifestyle  . Physical activity    Days per week: Not on file    Minutes per session: Not on file  . Stress: Not on file  Relationships  . Social Herbalist on phone: Not on file    Gets together: Not on file    Attends religious service: Not on file    Active member of club or organization: Not on file    Attends meetings of clubs or organizations: Not on file  Relationship status: Not on file  Other Topics Concern  . Not on file  Social History Narrative  . Not on file     Observations/Objective: Awake, alert and oriented x 3   Review of Systems  Constitutional: Negative for fever, malaise/fatigue and weight loss.  HENT: Negative.  Negative for nosebleeds.   Eyes: Negative.  Negative for blurred vision, double vision and photophobia.  Respiratory: Negative.  Negative for cough and shortness of breath.   Cardiovascular: Negative.  Negative for chest pain, palpitations and leg swelling.  Gastrointestinal: Negative.  Negative for abdominal pain, blood  in stool, constipation, diarrhea, heartburn, melena, nausea and vomiting.  Musculoskeletal: Negative.  Negative for myalgias.  Neurological: Negative.  Negative for dizziness, focal weakness, seizures and headaches.  Psychiatric/Behavioral: Positive for depression. Negative for suicidal ideas. The patient is nervous/anxious.     Assessment and Plan: Diagnoses and all orders for this visit:  [facility]   Chronic hepatitis C without hepatic coma (HCC) Follow up with ID     Follow Up Instructions Return in about 2 months (around 09/23/2018) for 1:30 physical .     I discussed the assessment and treatment plan with the patient. The patient was provided an opportunity to ask questions and all were answered. The patient agreed with the plan and demonstrated an understanding of the instructions.   The patient was advised to call back or seek an in-person evaluation if the symptoms worsen or if the condition fails to improve as anticipated.  I provided 19 minutes of non-face-to-face time during this encounter including median intraservice time, reviewing previous notes, labs, imaging, medications and explaining diagnosis and management.  Claiborne RiggZelda W Fleming, FNP-BC

## 2018-10-12 ENCOUNTER — Ambulatory Visit: Payer: Self-pay | Admitting: Pharmacist

## 2019-01-24 ENCOUNTER — Other Ambulatory Visit: Payer: Self-pay

## 2019-01-24 ENCOUNTER — Ambulatory Visit: Payer: Self-pay | Attending: Internal Medicine

## 2019-01-24 DIAGNOSIS — Z23 Encounter for immunization: Secondary | ICD-10-CM | POA: Insufficient documentation

## 2019-01-24 NOTE — Progress Notes (Signed)
   Covid-19 Vaccination Clinic  Name:  Tyrone Nielsen    MRN: [id number] DOB: 06/09/1955  01/24/2019  [name] was observed post Covid-19 immunization for 15 minutes without incidence. He was provided with Vaccine Information Sheet and instruction to access the V-Safe system.   [name] was instructed to call 911 with any severe reactions post vaccine: Marland Kitchen Difficulty breathing  . Swelling of your face and throat  . A fast heartbeat  . A bad rash all over your body  . Dizziness and weakness    Immunizations Administered    Name Date Dose VIS Date Route   Pfizer COVID-19 Vaccine 01/24/2019  2:18 PM 0.3 mL 12/25/2018 Intramuscular   Manufacturer: [employer], [employer]   Lot: [id number]   NDC: [address]-6

## 2019-02-14 ENCOUNTER — Ambulatory Visit: Payer: Self-pay | Attending: Internal Medicine

## 2019-02-14 DIAGNOSIS — Z23 Encounter for immunization: Secondary | ICD-10-CM | POA: Insufficient documentation

## 2019-02-14 NOTE — Progress Notes (Signed)
   Covid-19 Vaccination Clinic  Name:  Tyrone Nielsen    MRN: [id number] DOB: 1955-07-26  02/14/2019  Mr. Tyrone Nielsen was observed post Covid-19 immunization for 15 minutes without incidence. He was provided with Vaccine Information Sheet and instruction to access the V-Safe system.   Mr. Tyrone Nielsen was instructed to call 911 with any severe reactions post vaccine: Marland Kitchen Difficulty breathing  . Swelling of your face and throat  . A fast heartbeat  . A bad rash all over your body  . Dizziness and weakness    Immunizations Administered    Name Date Dose VIS Date Route   Pfizer COVID-19 Vaccine 02/14/2019  1:33 PM 0.3 mL 12/25/2018 Intramuscular   Manufacturer: [employer], [employer]   Lot: [id number]   NDC: [address]-9

## 2019-07-21 ENCOUNTER — Ambulatory Visit (HOSPITAL_COMMUNITY): Payer: Self-pay | Admitting: Licensed Clinical Social Worker

## 2019-07-22 ENCOUNTER — Encounter (HOSPITAL_COMMUNITY): Payer: Self-pay | Admitting: Licensed Clinical Social Worker

## 2019-07-22 ENCOUNTER — Ambulatory Visit (INDEPENDENT_AMBULATORY_CARE_PROVIDER_SITE_OTHER): Payer: Self-pay | Admitting: Licensed Clinical Social Worker

## 2019-07-22 ENCOUNTER — Other Ambulatory Visit: Payer: Self-pay

## 2019-07-22 DIAGNOSIS — F411 Generalized anxiety disorder: Secondary | ICD-10-CM

## 2019-07-22 DIAGNOSIS — F332 Major depressive disorder, recurrent severe without psychotic features: Secondary | ICD-10-CM

## 2019-07-22 NOTE — Progress Notes (Signed)
Comprehensive Clinical Assessment (CCA) Note  07/22/2019 Tyrone Nielsen [id number]  Visit Diagnosis:      ICD-10-CM   1. Severe episode of recurrent major depressive disorder, without psychotic features ([place])  F33.2   2. Generalized anxiety disorder  F41.1     Client is a 64 year old male. Client is referred by [facility]  for anxiety and depression.   Client states mental health symptoms as evidenced by:   sadness, anxious, isolation, flat affect, Fatigue; Change in energy/activity; Sleep (too much or little); Worthlessness, Tension; Worrying      Client denies suicidal and homicidal ideations  Client denies hallucinations and delusions   Client was screened for the following SDOH: smoking, depression, social interactions, depression.   Assessment Information that integrates subjective and objective details with a therapist's professional interpretation:   LCSW and pt met for 30 min initial evaluation. Pt was alert and oriented x 5, dressed approprietly, and actively engaging in session. Tyrone Nielsen reports that his symptoms have been well managed by medication, pt reports Latuda 60 mg tablet, bupropion HCl SR 150 mg tablet, and busPIRone 10 mg tablet. LCSW spoke with pt about therapy vs medication mgmt. Pt opted for med mgmt only at this time.    Client meets criteria for anxiety and depression    Client states use of the following substances: Sober for over 10 years     Client was in agreement with treatment recommendations.    CCA Screening, Triage and Referral (STR)  Patient Reported Information Referral name: Tyrone Nielsen    Whom do you see for routine medical problems? I don't have a doctor   Have You Recently Been in Any Inpatient Treatment (Hospital/Detox/Crisis Center/28-Day Program)? No   Have You Ever Received Services From [employer] Before? Yes   Have You Recently Had Any Thoughts About Hurting Yourself? No  Are You Planning to Commit Suicide/Harm Yourself At This  time? No   Have you Recently Had Thoughts About [place]? No   Have You Used Any Alcohol or Drugs in the Past 24 Hours? No   Do You Currently Have a Therapist/Psychiatrist? No   Have You Been Recently Discharged From Any Office Practice or Programs? No     CCA Screening Triage Referral Assessment Type of Contact: Tele-Assessment  Is this Initial or Reassessment? Initial Assessment   Patient Reported Information Reviewed? Yes   Is CPS involved or ever been involved? Never  Is APS involved or ever been involved? Never    Location of Assessment: [place] of Residence: [place]   Patient Currently Receiving the Following Services: No data recorded  Determination of Need: No data recorded  Options For Referral: Medication Management     CCA Biopsychosocial  Intake/Chief Complaint:  CCA Intake With Chief Complaint Chief Complaint/Presenting Problem: anxiety and depression Patient's Currently Reported Symptoms/Problems: sadness, anxious, isolation, flat affect Individual's Strengths: fix things Type of Services Patient Feels Are Needed: med mgmt  Mental Health Symptoms Depression:  Depression: Fatigue, Change in energy/activity, Sleep (too much or little), Worthlessness  Mania:     Anxiety:   Anxiety: Tension, Worrying  Psychosis:     Trauma:     Obsessions:     Compulsions:     Inattention:     Hyperactivity/Impulsivity:     Oppositional/Defiant Behaviors:     Emotional Irregularity:     Other Mood/Personality Symptoms:      Mental Status Exam Appearance and self-care  Stature:  Stature:  Average  Weight:  Weight: Average weight  Clothing:  Clothing: Casual  Grooming:  Grooming: Normal  Cosmetic use:  Cosmetic Use: None  Posture/gait:  Posture/Gait: Normal  Motor activity:  Motor Activity: Slowed  Sensorium  Attention:     Concentration:  Concentration: Normal  Orientation:  Orientation: X5   Recall/memory:  Recall/Memory: Normal  Affect and Mood  Affect:  Affect: Depressed, Flat  Mood:     Relating  Eye contact:  Eye Contact: Normal  Facial expression:  Facial Expression: Depressed  Attitude toward examiner:  Attitude Toward Examiner: Cooperative  Thought and Language  Speech flow: Speech Flow: Clear and Coherent  Thought content:     Preoccupation:  Preoccupations: None  Hallucinations:     Organization:     Transport planner of Knowledge:  Fund of Knowledge: Fair  Intelligence:  Intelligence: Average  Abstraction:     Judgement:  Judgement: Good  Reality Testing:     Insight:  Insight: Fair  Decision Making:     Social Functioning  Social Maturity:  Social Maturity: Isolates  Social Judgement:  Social Judgement: Normal  Stress  Stressors:  Stressors: Relationship  Coping Ability:     Skill Deficits:     Supports:  Supports: Friends/Service system, Family     Religion: Religion/Spirituality Are You A Religious Person?: Yes What is Your Religious Affiliation?: Non-Denominational  Leisure/Recreation: Leisure / Recreation Do You Have Hobbies?: Yes Leisure and Hobbies: non reproted  Exercise/Diet: Exercise/Diet Do You Exercise?: Yes What Type of Exercise Do You Do?: Run/Walk How Many Times a Week Do You Exercise?: 4-5 times a week Have You Gained or Lost A Significant Amount of Weight in the Past Six Months?: No Do You Follow a Special Diet?: No Do You Have Any Trouble Sleeping?: No     CCA Family/Childhood History  Family and Relationship History: Family history Marital status: Divorced Divorced, when?: 2000 What types of issues is patient dealing with in the relationship?: not health, drug and alcohol related Are you sexually active?: No Does patient have children?: Yes How many children?: 1 How is patient's relationship with their children?: good at [place] pt does not always agree with life style choices in regards to her  drinking  Childhood History:  Childhood History By whom was/is the patient raised?: Mother    DSM5 Diagnoses: Patient Active Problem List   Diagnosis Date Noted  . Chronic hepatitis C without hepatic coma ([place]) 05/06/2018    Patient Centered Plan: Patient is on the following Treatment Plan(s):  Anxiety and Depression     Dory Horn

## 2019-07-28 ENCOUNTER — Telehealth (HOSPITAL_COMMUNITY): Payer: Self-pay

## 2019-07-29 ENCOUNTER — Telehealth (INDEPENDENT_AMBULATORY_CARE_PROVIDER_SITE_OTHER): Payer: Self-pay | Admitting: Psychiatric/Mental Health

## 2019-07-29 ENCOUNTER — Encounter (HOSPITAL_COMMUNITY): Payer: Self-pay | Admitting: Psychiatric/Mental Health

## 2019-07-29 ENCOUNTER — Other Ambulatory Visit: Payer: Self-pay

## 2019-07-29 DIAGNOSIS — F3340 Major depressive disorder, recurrent, in remission, unspecified: Secondary | ICD-10-CM

## 2019-07-29 DIAGNOSIS — F1420 Cocaine dependence, uncomplicated: Secondary | ICD-10-CM | POA: Insufficient documentation

## 2019-07-29 DIAGNOSIS — F1120 Opioid dependence, uncomplicated: Secondary | ICD-10-CM | POA: Insufficient documentation

## 2019-07-29 DIAGNOSIS — F1020 Alcohol dependence, uncomplicated: Secondary | ICD-10-CM | POA: Insufficient documentation

## 2019-07-29 DIAGNOSIS — F1121 Opioid dependence, in remission: Secondary | ICD-10-CM

## 2019-07-29 DIAGNOSIS — F329 Major depressive disorder, single episode, unspecified: Secondary | ICD-10-CM | POA: Insufficient documentation

## 2019-07-29 DIAGNOSIS — F411 Generalized anxiety disorder: Secondary | ICD-10-CM

## 2019-07-29 MED ORDER — LATUDA 60 MG PO TABS: 1.0000 | ORAL_TABLET | Freq: Every evening | ORAL | 2 refills | Status: DC

## 2019-07-29 MED ORDER — BUSPIRONE HCL 10 MG PO TABS: 10.0000 mg | ORAL_TABLET | Freq: Two times a day (BID) | ORAL | 2 refills | Status: AC

## 2019-07-29 MED ORDER — BUPROPION HCL ER (SR) 150 MG PO TB12: 150.0000 mg | ORAL_TABLET | Freq: Two times a day (BID) | ORAL | 2 refills | Status: DC

## 2019-07-29 NOTE — Progress Notes (Signed)
Psychiatric Initial Adult Assessment   Virtual Visit via Video Note  I connected with  Tyrone Nielsen  on 07//21  by a video enabled telemedicine application and verified that I am speaking with the correct person using two identifiers.  Location: Patient: Home Provider: Home office  I discussed the limitations of evaluation and management by telemedicine and the availability of in person appointments. The patient expressed understanding and agreed to proceed.  I provided 45 minutes of non-face-to-face time during this encounter.  Patient Identification: Tyrone Nielsen MRN:  [id number] Date of Evaluation:  07/29/2019 Referral Source: Vesta Mixer  Chief Complaint:  "I'm ok, everything is the same" Visit Diagnosis:    ICD-10-CM   1. Recurrent major depressive disorder, in remission (HCC)  F33.40 buPROPion (WELLBUTRIN SR) 150 MG 12 hr tablet    Lurasidone HCl (LATUDA) 60 MG TABS  2. GAD (generalized anxiety disorder)  F41.1 busPIRone (BUSPAR) 10 MG tablet  3. Opioid dependence in remission (HCC)  F11.21   4. Acute alcoholism (HCC)  F10.20   5. Cocaine dependence, continuous (HCC)  F14.20     History of Present Illness: Tyrone Nielsen is a 64 year-old male seen today for initial psych evaluation.  Patient was referred to outpatient psychiatry by [facility] for medication management.  On assessment Tyrone Nielsen reports that his mood is stable, "everything is the same ".  He states his appetite is good, and his sleep is good however he gets up at 4 AM because he works in [place] and has to be to work at 430 he states he likes to get an early start.  He denies SI, HI and AVH.  His last appointment was with [facility] on April to 6 and he just refilled his medications a couple of days ago.  He denies any medication side effects and feels as though medication regimen is working effectively.  Currently he is prescribed BuSpar 10 g daily Wellbutrin 150 mg 12-hour tablet twice daily and Latuda 60 mg daily.  Writer will refill  medications for the next 3 months, and Tyrone Nielsen has agreed to follow-up for med management appointment in 12 weeks no further concerns at this time.  Associated Signs/Symptoms: Depression Symptoms:  na (Hypo) Manic Symptoms:  na Anxiety Symptoms:  na Psychotic Symptoms:  na PTSD Symptoms: NA  Past Psychiatric History: MDD, GAD  Previous Psychotropic Medications: No   Substance Abuse History in the last 12 months:  Yes.    Consequences of Substance Abuse: NA  Past Medical History:  Past Medical History:  Diagnosis Date  . Anxiety   . Depression   . Hepatitis C    History reviewed. No pertinent surgical history.  Family Psychiatric History: unknown  Family History:  Family History  Problem Relation Age of Onset  . Hypertension Neg Hx   . Intellectual disability Neg Hx     Social History:   Social History   Socioeconomic History  . Marital status: Divorced    Spouse name: Not on file  . Number of children: 1  . Years of education: 10  . Highest education level: Not on file  Occupational History  . Occupation: Retired    Comment: Contstruction  Tobacco Use  . Smoking status: Former Games developer  . Smokeless tobacco: Never Used  Vaping Use  . Vaping Use: Never used  Substance and Sexual Activity  . 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 Drug use: Not Currently  . Sexual activity: Not Currently  Other Topics Concern  . Not on file  Social History Narrative  . Not on file   Social Determinants of Health   Financial Resource Strain: Low Risk   . Difficulty of Paying Living Expenses: Not hard at all  Food Insecurity: No Food Insecurity  . Worried About Programme researcher, broadcasting/film/video in the Last Year: Never true  . Ran Out of Food in the Last Year: Never true  Transportation Needs: No Transportation Needs  . Lack of Transportation (Medical): No  . Lack of Transportation (Non-Medical): No  Physical Activity: Sufficiently Active   . Days of Exercise per Week: 7 days  . Minutes of Exercise per Session: 60 min  Stress: Stress Concern Present  . Feeling of Stress : To some extent  Social Connections: Moderately Isolated  . Frequency of Communication with Friends and Family: Three times a week  . Frequency of Social Gatherings with Friends and Family: Twice a week  . Attends Religious Services: Never  . Active Member of Clubs or Organizations: No  . Attends Banker Meetings: 1 to 4 times per year  . Marital Status: Divorced    Additional Social History: unknown  Allergies:  No Known Allergies  Metabolic Disorder Labs: No results found for: HGBA1C, MPG No results found for: PROLACTIN No results found for: CHOL, TRIG, HDL, CHOLHDL, VLDL, LDLCALC Lab Results  Component Value Date   TSH 3.710 03/13/2018    Therapeutic Level Labs: No results found for: LITHIUM No results found for: CBMZ No results found for: VALPROATE  Current Medications: Current Outpatient Medications  Medication Sig Dispense Refill  . acetaminophen (TYLENOL) 500 MG tablet Take 500 mg by mouth every 6 (six) hours as needed for mild pain.    Marland Kitchen buPROPion (WELLBUTRIN SR) 150 MG 12 hr tablet Take 1 tablet (150 mg total) by mouth 2 (two) times daily. 60 tablet 2  . busPIRone (BUSPAR) 10 MG tablet Take 1 tablet (10 mg total) by mouth 2 (two) times daily. 60 tablet 2  . Glecaprevir-Pibrentasvir (MAVYRET) 100-40 MG TABS Take 3 tablets by mouth daily with breakfast. 84 tablet 1  . Lurasidone HCl (LATUDA) 60 MG TABS Take 1 tablet (60 mg total) by mouth every evening. 30 tablet 2  . Multiple Vitamin (MULTIVITAMIN WITH MINERALS) TABS tablet Take 1 tablet by mouth daily.     No current facility-administered medications for this visit.    Musculoskeletal: Strength & Muscle Tone: within normal limits Gait & Station: normal Patient leans: N/A  Psychiatric Specialty Exam: Review of Systems  There were no vitals taken for this  visit.There is no height or weight on file to calculate BMI.  General Appearance: Casual  Eye Contact:  Good  Speech:  Clear and Coherent  Volume:  Normal  Mood:  Euthymic  Affect:  Appropriate  Thought Process:  Coherent and Descriptions of Associations: Intact  Orientation:  Full (Time, Place, and Person)  Thought Content:  WDL and Logical  Suicidal Thoughts:  No  Homicidal Thoughts:  No  Memory:  Immediate;   Good  Judgement:  Fair  Insight:  Fair  Psychomotor Activity:  Normal  Concentration:  Concentration: Good  Recall:  Good  Fund of Knowledge:Good  Language: Good  Akathisia:  NA  Handed:  Right  AIMS (if indicated):  not done  Assets:  Communication Skills Desire for Improvement Financial Resources/Insurance Housing Social Support  ADL's:  Intact  Cognition: WNL  Sleep:  Good   Screenings: GAD-7  [facility] Visit from [facility]/23/2018 in [facility]  Total GAD-7 Score 12    PHQ2-9     Counselor from 07/22/2019 in [facility] Office Visit from 05/06/2018 in [facility] for Infectious Disease Office Visit from 03/23/2018 in [facility]  PHQ-2 Total Score 3 0 3  PHQ-9 Total Score 6 -- 16      Assessment and Plan: Patient appears to be well maintained on current medication regimen.  Patient will continue medication regimen as prescribed.  Patient will follow up in 12 weeks for med management appointment.    ICD-10-CM   1. Recurrent major depressive disorder, in remission (HCC)  F33.40 buPROPion (WELLBUTRIN SR) 150 MG 12 hr tablet    Lurasidone HCl (LATUDA) 60 MG TABS  2. GAD (generalized anxiety disorder)  F41.1 busPIRone (BUSPAR) 10 MG tablet  3. Opioid dependence in remission (HCC)  F11.21   4. Acute alcoholism (HCC)  F10.20   5. Cocaine dependence, continuous (HCC)  F14.20     Jearld Lesch, NP 7/15/20211:16 PM

## 2019-10-28 ENCOUNTER — Other Ambulatory Visit: Payer: Self-pay

## 2019-10-28 ENCOUNTER — Telehealth (INDEPENDENT_AMBULATORY_CARE_PROVIDER_SITE_OTHER): Payer: No Payment, Other | Admitting: Physician Assistant

## 2019-10-28 ENCOUNTER — Encounter (HOSPITAL_COMMUNITY): Payer: Self-pay | Admitting: Physician Assistant

## 2019-10-28 DIAGNOSIS — F411 Generalized anxiety disorder: Secondary | ICD-10-CM | POA: Diagnosis not present

## 2019-10-28 DIAGNOSIS — F3340 Major depressive disorder, recurrent, in remission, unspecified: Secondary | ICD-10-CM | POA: Diagnosis not present

## 2019-10-28 NOTE — Progress Notes (Addendum)
BH MD/PA/NP OP Progress Note  Virtual Visit via Telephone Note  I connected with Tyrone Nielsen on 10/28/2019 at  1:00 PM EDT by telephone and verified that I am speaking with the correct person using two identifiers.  Location: Patient: Home Provider: Office   I discussed the limitations, risks, security and privacy concerns of performing an evaluation and management service by telephone and the availability of in person appointments. I also discussed with the patient that there may be a patient responsible charge related to this service. The patient expressed understanding and agreed to proceed.  Follow Up Instructions:   I discussed the assessment and treatment plan with the patient. The patient was provided an opportunity to ask questions and all were answered. The patient agreed with the plan and demonstrated an understanding of the instructions.   The patient was advised to call back or seek an in-person evaluation if the symptoms worsen or if the condition fails to improve as anticipated.  I provided 40 minutes of non-face-to-face time during this encounter.   Meta Hatchet, PA   10/28/2019 4:31 PM Tyrone Nielsen  MRN:  [id number]  Chief Complaint: Follow up every 3 months for medication management.   HPI:  Tyrone Nielsen is a 64 year old, male with a past psych history significant for major depressive disorder, generalized anxiety disorder, opioid dependence, and cocaine dependence who presents to [facility] via telepsych for follow up medication management. Patient reports that he used to be seen at [facility] but has since started being seen at [facility]. Patient states that he follows up every 3 months for the management of his medications. Per patient, up until recently, his medications were working well to treat his depression and anxiety. For many days now, the patient has been feeling more anxious. Patient reports that this new anxiety has been going on for  roughly 2 weeks. Patient has tried exercising and going to the park for relief, but these efforts have been unsuccessful. Patient was asked if he has experienced depressed mood to which the patient replied "I have been mostly anxious, but the depression and anxiety go hand in hand." Patient feels that the anxiety feeds into the depression. Patient is currently taking Buspar 10 mg twice daily for the treatment of his anxiety. Writer discussed with patient about increasing Buspar dosage by 5 mg (Now: Buspar 15 mg twice daily for anxiety). Patient was agreeable to the adjustments in Buspar medication. Patient was asked if he wanted a new prescription or would he rather cut a pill in half and take 1.5 pills of Buspar twice daily. Patient states that he has a pill cutter and wouldn't mind cutting pills to meet dosage requirements. Patient states he has an abundance of Buspar and other medications, therefore, refills are not needed.  As of today, patient denies suicide or homicide ideation. Patient further denies auditory or visual hallucinations. Patient reports good appetite and endorses having a light breakfast and good supper. Patient's sleep has been good. He reports getting roughly 6 hours of sleep a night and feels well rested upon waking. Patient denies alcohol consumption and illicit drug use. Patient's mood has been overall well and he is living comfortably with his mother. Patient is currently retired.  Visit Diagnosis:    ICD-10-CM   1. Recurrent major depressive disorder, in remission (HCC)  F33.40   2. GAD (generalized anxiety disorder)  F41.1     Past Psychiatric History: Major Depressive Disorder Generalized Anxiety Disorder Opioid Dependence Acute Alcoholism (  Patient reports 12 years of sobriety) Cocaine dependence  Past Medical History:  Past Medical History:  Diagnosis Date   Anxiety    Depression    Hepatitis C    No past surgical history on file.  Family Psychiatric  History: unknown  Family History:  Family History  Problem Relation Age of Onset   Hypertension Neg Hx    Intellectual disability Neg Hx     Social History:  Social History   Socioeconomic History   Marital status: Divorced    Spouse name: Not on file   Number of children: 1   Years of education: 12   Highest education level: Not on file  Occupational History   Occupation: Retired    Comment: Contstruction  Tobacco Use   Smoking status: Former Smoker   Smokeless tobacco: Never Used  Building services engineer Use: Never used  Substance and Sexual Activity   Alcohol use: Not Currently    Comment: Prior heavy alcohol use, 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   Drug use: Not Currently   Sexual activity: Not Currently  Other Topics Concern   Not on file  Social History Narrative   Not on file   Social Determinants of Health   Financial Resource Strain: Low Risk    Difficulty of Paying Living Expenses: Not hard at all  Food Insecurity: No Food Insecurity   Worried About Programme researcher, broadcasting/film/video in the Last Year: Never true   Barista in the Last Year: Never true  Transportation Needs: No Transportation Needs   Lack of Transportation (Medical): No   Lack of Transportation (Non-Medical): No  Physical Activity: Sufficiently Active   Days of Exercise per Week: 7 days   Minutes of Exercise per Session: 60 min  Stress: Stress Concern Present   Feeling of Stress : To some extent  Social Connections: Moderately Isolated   Frequency of Communication with Friends and Family: Three times a week   Frequency of Social Gatherings with Friends and Family: Twice a week   Attends Religious Services: Never   Database administrator or Organizations: No   Attends Engineer, structural: 1 to 4 times per year   Marital Status: Divorced    Allergies: No Known Allergies  Metabolic Disorder Labs: No results found for: HGBA1C, MPG No results found for: PROLACTIN No  results found for: CHOL, TRIG, HDL, CHOLHDL, VLDL, LDLCALC Lab Results  Component Value Date   TSH 3.710 03/13/2018   TSH 4.860 (H) 02/23/2018    Therapeutic Level Labs: No results found for: LITHIUM No results found for: VALPROATE No components found for:  CBMZ  Current Medications: Current Outpatient Medications  Medication Sig Dispense Refill   acetaminophen (TYLENOL) 500 MG tablet Take 500 mg by mouth every 6 (six) hours as needed for mild pain.     buPROPion (WELLBUTRIN SR) 150 MG 12 hr tablet Take 1 tablet (150 mg total) by mouth 2 (two) times daily. 60 tablet 2   Glecaprevir-Pibrentasvir (MAVYRET) 100-40 MG TABS Take 3 tablets by mouth daily with breakfast. 84 tablet 1   Lurasidone HCl (LATUDA) 60 MG TABS Take 1 tablet (60 mg total) by mouth every evening. 30 tablet 2   Multiple Vitamin (MULTIVITAMIN WITH MINERALS) TABS tablet Take 1 tablet by mouth daily.     No current facility-administered medications for this visit.     Musculoskeletal: Strength & Muscle Tone: within normal limits Gait & Station: normal Patient leans: N/A  Psychiatric Specialty  Exam: Review of Systems  Constitutional: Positive for activity change.  HENT: Negative.   Eyes: Negative.   Respiratory: Negative.   Cardiovascular: Negative.   Gastrointestinal: Negative.   Endocrine: Negative.   Musculoskeletal: Negative.   Skin: Negative.   Neurological: Negative.   Hematological: Negative.   Psychiatric/Behavioral: Positive for agitation. Negative for hallucinations, sleep disturbance and suicidal ideas. The patient is nervous/anxious.     There were no vitals taken for this visit.There is no height or weight on file to calculate BMI.  General Appearance: Fairly Groomed  Eye Contact:  Good  Speech:  Clear and Coherent and Normal Rate  Volume:  Normal  Mood:  Negative  Affect:  Appropriate  Thought Process:  Coherent and Goal Directed  Orientation:  Full (Time, Place, and Person)   Thought Content: Logical   Suicidal Thoughts:  No  Homicidal Thoughts:  No  Memory:  Immediate;   Good Recent;   Good Remote;   Good  Judgement:  Good  Insight:  Good  Psychomotor Activity:  Normal  Concentration:  Concentration: Good and Attention Span: Good  Recall:  Good  Fund of Knowledge: Good  Language: Good  Akathisia:  Negative  Handed:  Right  AIMS (if indicated): not done  Assets:  Communication Skills Desire for Improvement Housing Physical Health Social Support  ADL's:  Intact  Cognition: WNL  Sleep:  Good   Screenings: GAD-7     Office Visit from 03/23/2018 in [facility]  Total GAD-7 Score 12    PHQ2-9     Counselor from 07/22/2019 in [facility] Office Visit from 05/06/2018 in [facility] for Infectious Disease Office Visit from 03/23/2018 in [facility]  PHQ-2 Total Score 3 0 3  PHQ-9 Total Score 6 -- 16       Assessment and Plan: Tyrone Nielsen is a 64 year old, male with a past psych history significant for major depressive disorder, generalized anxiety disorder, opioid dependence, and cocaine dependence who presents to [facility] via telepsych for follow up medication management. Patient reports new onset of anxiety occurring for the past 2 weeks.  1. GAD (generalized anxiety disorder) Onset of new anxiety unsuccessfully managed by current medication regimen.  Patient is currently taking Buspar 10 mg twice daily for the treatment of his anxiety. Writer discussed with patient about increasing Buspar dosage by 5 mg (Now: Buspar 15 mg twice daily for anxiety). Patient is agreeable to the adjustments in Buspar medication. Patient states that he has a pill cutter and wouldn't mind cutting pills to meet dosage requirements. Patient states he has an abundance of Buspar and other medications, therefore, refills are not needed.  busPIRone (BUSPAR) 10 mg, Take 1.5  tablets (15mg ) by mouth 2 (two) times daily.  Patient to follow up in 6 weeks.    , PA 10/28/2019, 4:31 PM

## 2019-11-09 ENCOUNTER — Telehealth (HOSPITAL_COMMUNITY): Payer: Self-pay | Admitting: *Deleted

## 2019-11-09 NOTE — Telephone Encounter (Signed)
Call from patient stating he was told by [place] to go to the pharmacy we are connected with to continue getting his meds. He is uninsured and [facility] as he needs patient assistance. Will reach out to Eddie his provider to call meds to [facility] pharmacy and patient is to call pharm to check on what paperwork he might need for patient assist help.

## 2019-11-10 MED FILL — !LATUDA 60MG TABLET: 30 days supply | Qty: 30 | Fill #0

## 2019-11-12 ENCOUNTER — Telehealth (HOSPITAL_COMMUNITY): Payer: Self-pay | Admitting: *Deleted

## 2019-11-12 NOTE — Telephone Encounter (Signed)
Called stating he had gotten several calls from this number. I had not called him but he still hasnt gotten his meds. Discussed with him last week going to [facility] but I dont see his meds have been called into them. Will follow up with Eddie re Rx.

## 2019-11-16 ENCOUNTER — Other Ambulatory Visit: Payer: Self-pay | Admitting: Physician Assistant

## 2019-11-16 MED FILL — BUPROPION HCL XL 150 MG TAB: 150 | 30 days supply | Qty: 60 | Fill #0

## 2019-11-16 MED FILL — busPIRone HCL 15 MG TABS: 15 | 30 days supply | Qty: 60 | Fill #0

## 2019-11-16 NOTE — Telephone Encounter (Signed)
[facility] contacted and patient's profile with new pharmacy was updated with current drug regimen. Patient was called multiple times via cell phone and land line to inform him of the pharmacy update and to see if there was any changes in current medications. Will continue to reach out to patient.

## 2019-11-16 NOTE — Telephone Encounter (Signed)
[facility] and patient's profile with new pharmacy was updated with current drug regimen. Patient was called multiple times via cell phone and land line to inform him of the pharmacy update and to see if there was any changes in his current medications. Will continue to reach out to patient.

## 2019-11-17 MED FILL — !LATUDA 60MG TABLET: 30 days supply | Qty: 30 | Fill #0

## 2019-11-23 ENCOUNTER — Other Ambulatory Visit: Payer: Self-pay

## 2019-11-23 ENCOUNTER — Ambulatory Visit: Payer: Self-pay | Attending: Nurse Practitioner

## 2019-12-05 ENCOUNTER — Other Ambulatory Visit: Payer: Self-pay | Admitting: Physician Assistant

## 2019-12-08 ENCOUNTER — Other Ambulatory Visit: Payer: Self-pay

## 2019-12-08 ENCOUNTER — Other Ambulatory Visit: Payer: Self-pay | Admitting: Psychiatry

## 2019-12-08 ENCOUNTER — Telehealth (INDEPENDENT_AMBULATORY_CARE_PROVIDER_SITE_OTHER): Payer: No Payment, Other | Admitting: Physician Assistant

## 2019-12-08 DIAGNOSIS — F3340 Major depressive disorder, recurrent, in remission, unspecified: Secondary | ICD-10-CM | POA: Diagnosis not present

## 2019-12-08 DIAGNOSIS — F411 Generalized anxiety disorder: Secondary | ICD-10-CM | POA: Diagnosis not present

## 2019-12-08 MED ORDER — BUSPIRONE HCL 15 MG PO TABS: 15.0000 mg | ORAL_TABLET | Freq: Two times a day (BID) | ORAL | 2 refills | Status: DC

## 2019-12-08 MED ORDER — LATUDA 60 MG PO TABS: 1.0000 | ORAL_TABLET | Freq: Every evening | ORAL | 2 refills | Status: DC

## 2019-12-08 MED ORDER — BUPROPION HCL ER (SR) 150 MG PO TB12: 150.0000 mg | ORAL_TABLET | Freq: Two times a day (BID) | ORAL | 2 refills | Status: DC

## 2019-12-08 MED FILL — ?BUSPIRONE HCL 15 MG TABLET: 15 | 30 days supply | Qty: 60 | Fill #0

## 2019-12-08 MED FILL — BUPROPION SR 150 MG TABLET: 150 | 30 days supply | Qty: 60 | Fill #0

## 2019-12-08 NOTE — Progress Notes (Signed)
BH MD/PA/NP OP Progress Note  Virtual Visit via Video Note  I connected with Tyrone Nielsen on 12/08/2019 at  1:00 PM EST by a video enabled telemedicine application and verified that I am speaking with the correct person using two identifiers.  Location: Patient: House Provider: Office   I discussed the limitations of evaluation and management by telemedicine and the availability of in person appointments. The patient expressed understanding and agreed to proceed.  Follow Up Instructions:   I discussed the assessment and treatment plan with the patient. The patient was provided an opportunity to ask questions and all were answered. The patient agreed with the plan and demonstrated an understanding of the instructions.   The patient was advised to call back or seek an in-person evaluation if the symptoms worsen or if the condition fails to improve as anticipated.  I provided 30 minutes of non-face-to-face time during this encounter.  Meta Hatchet, PA   12/08/2019 1:13 PM Tyrone Nielsen  MRN:  [id number]  Chief Complaint:  Follow up/medication management  HPI:  Tyrone Nielsen is a 64 year old, male with a past psych history significant for major depressive disorder, generalized anxiety disorder, opioid dependence, and cocaine dependence who presents to [facility] via telepsych for follow up and medication management. Patient reports that things have been going ok. Patient is currently taking the following medications:  Lurasidone (Latuda) 60 mg 1 x daily every evening Buspirone (Buspar) 15 mg 2 x daily  Bupropion  (Wellbutrin) 150 mg 12 hr tablet 2 times daily  Patient reports no issues with his current medication regimen. He states everything seems manageable and he believes that the dosage adjustment for Buspar from 10 mg to 15 mg has been helpful. Patient has no concerns during this time. Refills will be ordered for the patients  medication upon conclusion of the encounter.  Patient denies suicide and homicide ideation. He further denies audio and visual hallucinations. Patient reports good sleep and receives around 6 - 8 hours of sleep. Patient endorses feeling well rested upon waking. Patient endorses appetite and eat on average 2 meals a day. Patient denies alcohol use and illicit drug use. Patient denies tobacco use.  Visit Diagnosis:    ICD-10-CM   1. Generalized anxiety disorder  F41.1   2. Recurrent major depressive disorder, in remission (HCC)  F33.40     Past Psychiatric History: Major Depressive Disorder Generalized Anxiety Disorder Opioid Dependence Acute Alcoholism (Patient reports 12 years of sobriety) Cocaine dependence  Past Medical History:  Past Medical History:  Diagnosis Date  . Anxiety   . Depression   . Hepatitis C    No past surgical history on file.  Family Psychiatric History: Unknown  Family History:  Family History  Problem Relation Age of Onset  . Hypertension Neg Hx   . Intellectual disability Neg Hx     Social History:  Social History   Socioeconomic History  . Marital status: Divorced    Spouse name: Not on file  . Number of children: 1  . Years of education: 59  . Highest education level: Not on file  Occupational History  . Occupation: Retired    Comment: Contstruction  Tobacco Use  . Smoking status: Former Games developer  . Smokeless tobacco: Never Used  Vaping Use  . Vaping Use: Never used  Substance and Sexual Activity  . 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 Drug use: Not  Currently  . Sexual activity: Not Currently  Other Topics Concern  . Not on file  Social History Narrative  . Not on file   Social Determinants of Health   Financial Resource Strain: Low Risk   . Difficulty of Paying Living Expenses: Not hard at all  Food Insecurity: No Food Insecurity  . Worried About Programme researcher, broadcasting/film/video in the Last  Year: Never true  . Ran Out of Food in the Last Year: Never true  Transportation Needs: No Transportation Needs  . Lack of Transportation (Medical): No  . Lack of Transportation (Non-Medical): No  Physical Activity: Sufficiently Active  . Days of Exercise per Week: 7 days  . Minutes of Exercise per Session: 60 min  Stress: Stress Concern Present  . Feeling of Stress : To some extent  Social Connections: Moderately Isolated  . Frequency of Communication with Friends and Family: Three times a week  . Frequency of Social Gatherings with Friends and Family: Twice a week  . Attends Religious Services: Never  . Active Member of Clubs or Organizations: No  . Attends Banker Meetings: 1 to 4 times per year  . Marital Status: Divorced    Allergies: No Known Allergies  Metabolic Disorder Labs: No results found for: HGBA1C, MPG No results found for: PROLACTIN No results found for: CHOL, TRIG, HDL, CHOLHDL, VLDL, LDLCALC Lab Results  Component Value Date   TSH 3.710 03/13/2018   TSH 4.860 (H) 02/23/2018    Therapeutic Level Labs: No results found for: LITHIUM No results found for: VALPROATE No components found for:  CBMZ  Current Medications: Current Outpatient Medications  Medication Sig Dispense Refill  . acetaminophen (TYLENOL) 500 MG tablet Take 500 mg by mouth every 6 (six) hours as needed for mild pain.    Marland Kitchen buPROPion (WELLBUTRIN SR) 150 MG 12 hr tablet Take 1 tablet (150 mg total) by mouth 2 (two) times daily. 60 tablet 2  . Glecaprevir-Pibrentasvir (MAVYRET) 100-40 MG TABS Take 3 tablets by mouth daily with breakfast. 84 tablet 1  . Lurasidone HCl (LATUDA) 60 MG TABS Take 1 tablet (60 mg total) by mouth every evening. 30 tablet 2  . Multiple Vitamin (MULTIVITAMIN WITH MINERALS) TABS tablet Take 1 tablet by mouth daily.     No current facility-administered medications for this visit.     Musculoskeletal: Strength & Muscle Tone: Unable to assess due to  telehealth visit Gait & Station: Unable to assess due to telehealth visit Patient leans: Unable to assess due to telehealth visit  Psychiatric Specialty Exam: Review of Systems  Psychiatric/Behavioral: Negative for dysphoric mood, hallucinations, sleep disturbance and suicidal ideas. The patient is not nervous/anxious.     There were no vitals taken for this visit.There is no height or weight on file to calculate BMI.  General Appearance: Well Groomed  Eye Contact:  Good  Speech:  Clear and Coherent and Normal Rate  Volume:  Normal  Mood:  Patient was pleasant  Affect:  Appropriate  Thought Process:  Coherent and Goal Directed  Orientation:  Full (Time, Place, and Person)  Thought Content: WDL and Logical   Suicidal Thoughts:  No  Homicidal Thoughts:  No  Memory:  Immediate;   Good Recent;   Good Remote;   Good  Judgement:  Good  Insight:  Good  Psychomotor Activity:  Normal  Concentration:  Concentration: Good and Attention Span: Good  Recall:  Good  Fund of Knowledge: Good  Language: Good  Akathisia:  NA  Handed:  Right  AIMS (if indicated): not done  Assets:  Communication Skills Desire for Improvement Housing Physical Health Social Support  ADL's:  Intact  Cognition: WNL  Sleep:  Good   Screenings: GAD-7     Office Visit from 03/23/2018 in [facility]  Total GAD-7 Score 12    PHQ2-9     Counselor from 07/22/2019 in [facility] Office Visit from 05/06/2018 in [facility] Office Visit from 03/23/2018 in [facility]  PHQ-2 Total Score 3 0 3  PHQ-9 Total Score 6 -- 16       Assessment and Plan:  Tyrone Nielsen is a 64 year old, male with a past psych history significant for major depressive disorder, generalized anxiety disorder, opioid dependence, and cocaine dependence who presents to [facility] via  telepsych for follow up and medication management. Patient reports no issues with his current medication regimen.Patient has no concerns during this time. Refills will be ordered for the patients medication upon conclusion of the encounter.  1. Generalized anxiety disorder  - busPIRone (BUSPAR) 15 MG tablet; Take 1 tablet (15 mg total) by mouth 2 (two) times daily.  Dispense: 60 tablet; Refill: 2  2. Recurrent major depressive disorder, in remission (HCC)  - buPROPion (WELLBUTRIN SR) 150 MG 12 hr tablet; Take 1 tablet (150 mg total) by mouth 2 (two) times daily.  Dispense: 60 tablet; Refill: 2 - Lurasidone HCl (LATUDA) 60 MG TABS; Take 1 tablet (60 mg total) by mouth every evening.  Dispense: 30 tablet; Refill: 2  Patient to follow up in 2 months  Meta Hatchet, PA 12/08/2019, 1:13 PM

## 2019-12-10 ENCOUNTER — Encounter (HOSPITAL_COMMUNITY): Payer: Self-pay | Admitting: Physician Assistant

## 2019-12-15 ENCOUNTER — Encounter: Payer: Self-pay | Admitting: Nurse Practitioner

## 2019-12-15 ENCOUNTER — Ambulatory Visit: Payer: Self-pay | Attending: Nurse Practitioner | Admitting: Nurse Practitioner

## 2019-12-15 ENCOUNTER — Other Ambulatory Visit: Payer: Self-pay | Admitting: Nurse Practitioner

## 2019-12-15 ENCOUNTER — Other Ambulatory Visit: Payer: Self-pay

## 2019-12-15 VITALS — BP 148/101 | HR 73 | Temp 98.6°F | Ht 69.0 in | Wt 180.0 lb

## 2019-12-15 DIAGNOSIS — Z130 Encounter for screening for diseases of the blood and blood-forming organs and certain disorders involving the immune mechanism: Secondary | ICD-10-CM

## 2019-12-15 DIAGNOSIS — M25561 Pain in right knee: Secondary | ICD-10-CM

## 2019-12-15 DIAGNOSIS — M720 Palmar fascial fibromatosis [Dupuytren]: Secondary | ICD-10-CM

## 2019-12-15 DIAGNOSIS — Z131 Encounter for screening for diabetes mellitus: Secondary | ICD-10-CM

## 2019-12-15 DIAGNOSIS — Z13220 Encounter for screening for lipoid disorders: Secondary | ICD-10-CM

## 2019-12-15 DIAGNOSIS — Z1211 Encounter for screening for malignant neoplasm of colon: Secondary | ICD-10-CM

## 2019-12-15 DIAGNOSIS — R7401 Elevation of levels of liver transaminase levels: Secondary | ICD-10-CM

## 2019-12-15 DIAGNOSIS — G8929 Other chronic pain: Secondary | ICD-10-CM

## 2019-12-15 MED ORDER — DICLOFENAC SODIUM 1 % EX GEL: 2.0000 g | Freq: Four times a day (QID) | CUTANEOUS | 1 refills | Status: DC

## 2019-12-15 MED FILL — DICLOFENAC SODIUM 1% GEL: 1 | 12 days supply | Qty: 100 | Fill #0

## 2019-12-15 NOTE — Progress Notes (Signed)
Assessment & Plan:  Tyrone Nielsen was seen today for follow-up.  Diagnoses and all orders for this visit:  Transaminitis -     CMP14+EGFR  Encounter for screening for diabetes mellitus -     Hemoglobin A1c  Screening for deficiency anemia -     CBC  Lipid screening -     Lipid panel  Colon cancer screening -     Fecal occult blood, imunochemical([facility])  Chronic pain of right knee -     diclofenac Sodium (VOLTAREN) 1 % GEL; Apply 2 g topically 4 (four) times daily. Work on losing weight to help reduce joint pain. May alternate with heat and ice application for pain relief. May also alternate with acetaminophen and Ibuprofen as prescribed pain relief. Other alternatives include massage, acupuncture and water aerobics.  You must stay active and avoid a sedentary lifestyle.    Patient has been counseled on age-appropriate routine health concerns for screening and prevention. These are reviewed and up-to-date. Referrals have been placed accordingly. Immunizations are up-to-date or declined.    Subjective:   Chief Complaint  Patient presents with   Follow-up    Pt. is here for routine follow up.    HPI Tyrone Nielsen 64 y.o. male presents to office today for follow up.  has a past medical history of Anxiety, Depression, and Hepatitis C, opioid dependence, cocaine dependence.  He goes to [facility] for generalized anxiety disorder depression.  Last tele visit was 12/08/2019.  He is currently taking Latuda 60 mg daily, BuSpar 15 mg twice daily and Wellbutrin 150 mg twice daily.  I have actually not personally seen Tyrone Nielsen in this office since May 04, 2018.  He did have a video visit 07/20/2018 however I requested that he come in today for blood work and vital signs.  BP Readings from Last 3 Encounters:  12/15/19 (!) 148/101  05/06/18 (!) 154/94  03/23/18 128/83   He states he was running at the park for a few weeks last year and feels he  may have sprained his knee after doing so. It does swell at times and describes pain as aching. He does have a knee brace but it is not very helpful. Takes NSAIDs periodically for pain and swelling.   Blood pressure is elevated x2 today.  We will have him return in a few weeks for repeat blood pressure check.  If elevated may need to start amlodipine 5 mg daily.   Review of Systems  Constitutional: Negative for fever, malaise/fatigue and weight loss.  HENT: Negative.  Negative for nosebleeds.   Eyes: Negative.  Negative for blurred vision, double vision and photophobia.  Respiratory: Negative.  Negative for cough and shortness of breath.   Cardiovascular: Negative.  Negative for chest pain, palpitations and leg swelling.  Gastrointestinal: Negative.  Negative for heartburn, nausea and vomiting.  Musculoskeletal: Positive for joint pain. Negative for myalgias.  Neurological: Negative.  Negative for dizziness, focal weakness, seizures and headaches.  Psychiatric/Behavioral: Positive for depression. Negative for suicidal ideas.    Past Medical History:  Diagnosis Date   Anxiety    Depression    Hepatitis C     History reviewed. No pertinent surgical history.  Family History  Problem Relation Age of Onset   Hypertension Neg Hx    Intellectual disability Neg Hx     Social History Reviewed with no changes to be made today.   Outpatient Medications Prior to Visit  Medication Sig Dispense Refill  acetaminophen (TYLENOL) 500 MG tablet Take 500 mg by mouth every 6 (six) hours as needed for mild pain.     buPROPion (WELLBUTRIN SR) 150 MG 12 hr tablet Take 1 tablet (150 mg total) by mouth 2 (two) times daily. 60 tablet 2   busPIRone (BUSPAR) 15 MG tablet Take 1 tablet (15 mg total) by mouth 2 (two) times daily. 60 tablet 2   Lurasidone HCl (LATUDA) 60 MG TABS Take 1 tablet (60 mg total) by mouth every evening. 30 tablet 2   Multiple Vitamin (MULTIVITAMIN WITH MINERALS) TABS  tablet Take 1 tablet by mouth daily.     Glecaprevir-Pibrentasvir (MAVYRET) 100-40 MG TABS Take 3 tablets by mouth daily with breakfast. (Patient not taking: Reported on 12/15/2019) 84 tablet 1   No facility-administered medications prior to visit.    No Known Allergies     Objective:    BP (!) 148/101 (BP Location: Left Arm, Patient Position: Sitting, Cuff Size: Normal)    Pulse 73    Temp 98.6 F (37 C) (Temporal)    Ht 5' 9"  (1.753 m)    Wt 180 lb (81.6 kg)    SpO2 97%    BMI 26.58 kg/m  Wt Readings from Last 3 Encounters:  12/15/19 180 lb (81.6 kg)  05/06/18 171 lb (77.6 kg)  03/23/18 172 lb 9.6 oz (78.3 kg)    Physical Exam Vitals and nursing note reviewed.  Constitutional:      Appearance: He is well-developed.  HENT:     Head: Normocephalic and atraumatic.  Cardiovascular:     Rate and Rhythm: Normal rate and regular rhythm.     Heart sounds: Normal heart sounds. No murmur heard.  No friction rub. No gallop.   Pulmonary:     Effort: Pulmonary effort is normal. No tachypnea or respiratory distress.     Breath sounds: Normal breath sounds. No decreased breath sounds, wheezing, rhonchi or rales.  Chest:     Chest wall: No tenderness.  Abdominal:     General: Bowel sounds are normal.     Palpations: Abdomen is soft.  Musculoskeletal:        General: Normal range of motion.     Cervical back: Normal range of motion.     Right lower leg: Swelling present.  Skin:    General: Skin is warm and dry.  Neurological:     Mental Status: He is alert and oriented to person, place, and time.     Coordination: Coordination normal.  Psychiatric:        Behavior: Behavior normal. Behavior is cooperative.        Thought Content: Thought content normal.        Judgment: Judgment normal.          Patient has been counseled extensively about nutrition and exercise as well as the importance of adherence with medications and regular follow-up. The patient was given clear  instructions to go to ER or return to medical center if symptoms don't improve, worsen or new problems develop. The patient verbalized understanding.   Follow-up: Return in about 4 weeks (around 01/12/2020) for BP CHECK WITH LUKE.   Gildardo Pounds, FNP-BC [facility] [place], [place]   12/15/2019, 2:15 PM

## 2019-12-16 LAB — HEMOGLOBIN A1C
Est. average glucose Bld gHb Est-mCnc: 105 mg/dL
Hgb A1c MFr Bld: 5.3 % (ref 4.8–5.6)

## 2019-12-16 LAB — CBC
Hematocrit: 47.3 % (ref 37.5–51.0)
Hemoglobin: 16.4 g/dL (ref 13.0–17.7)
MCH: 31.1 pg (ref 26.6–33.0)
MCHC: 34.7 g/dL (ref 31.5–35.7)
MCV: 90 fL (ref 79–97)
Platelets: 303 10*3/uL (ref 150–450)
RBC: 5.27 x10E6/uL (ref 4.14–5.80)
RDW: 11.7 % (ref 11.6–15.4)
WBC: 8.9 10*3/uL (ref 3.4–10.8)

## 2019-12-16 LAB — CMP14+EGFR
ALT: 18 IU/L (ref 0–44)
AST: 15 IU/L (ref 0–40)
Albumin/Globulin Ratio: 1.8 (ref 1.2–2.2)
Albumin: 4.9 g/dL — ABNORMAL HIGH (ref 3.8–4.8)
Alkaline Phosphatase: 106 IU/L (ref 44–121)
BUN/Creatinine Ratio: 12 (ref 10–24)
BUN: 10 mg/dL (ref 8–27)
Bilirubin Total: 0.4 mg/dL (ref 0.0–1.2)
CO2: 25 mmol/L (ref 20–29)
Calcium: 9.9 mg/dL (ref 8.6–10.2)
Chloride: 95 mmol/L — ABNORMAL LOW (ref 96–106)
Creatinine, Ser: 0.81 mg/dL (ref 0.76–1.27)
GFR calc Af Amer: 109 mL/min/{1.73_m2} (ref >59)
GFR calc non Af Amer: 94 mL/min/{1.73_m2} (ref >59)
Globulin, Total: 2.7 g/dL (ref 1.5–4.5)
Glucose: 89 mg/dL (ref 65–99)
Potassium: 4.8 mmol/L (ref 3.5–5.2)
Sodium: 134 mmol/L (ref 134–144)
Total Protein: 7.6 g/dL (ref 6.0–8.5)

## 2019-12-16 LAB — LIPID PANEL
Chol/HDL Ratio: 4.9 ratio (ref 0.0–5.0)
Cholesterol, Total: 231 mg/dL — ABNORMAL HIGH (ref 100–199)
HDL: 47 mg/dL (ref >39)
LDL Chol Calc (NIH): 159 mg/dL — ABNORMAL HIGH (ref 0–99)
Triglycerides: 136 mg/dL (ref 0–149)
VLDL Cholesterol Cal: 25 mg/dL (ref 5–40)

## 2019-12-17 MED FILL — !LATUDA 60MG TABLET: 30 days supply | Qty: 30 | Fill #0

## 2019-12-21 ENCOUNTER — Other Ambulatory Visit: Payer: Self-pay | Admitting: Nurse Practitioner

## 2019-12-21 MED ORDER — ATORVASTATIN CALCIUM 10 MG PO TABS: 10.0000 mg | ORAL_TABLET | Freq: Every day | ORAL | 3 refills | Status: DC

## 2019-12-22 MED FILL — ?ATORVASTATIN 10 MG TABLET: 10 | 30 days supply | Qty: 30 | Fill #0

## 2019-12-23 LAB — FECAL OCCULT BLOOD, IMMUNOCHEMICAL: Fecal Occult Bld: POSITIVE — AB

## 2019-12-24 ENCOUNTER — Encounter: Payer: Self-pay | Admitting: Orthopaedic Surgery

## 2019-12-24 ENCOUNTER — Ambulatory Visit (INDEPENDENT_AMBULATORY_CARE_PROVIDER_SITE_OTHER): Payer: Self-pay | Admitting: Orthopaedic Surgery

## 2019-12-24 VITALS — Ht 69.0 in | Wt 180.0 lb

## 2019-12-24 DIAGNOSIS — M720 Palmar fascial fibromatosis [Dupuytren]: Secondary | ICD-10-CM | POA: Insufficient documentation

## 2019-12-24 NOTE — Progress Notes (Signed)
Office Visit Note   Patient: Tyrone Nielsen           Date of Birth: 1955/11/13           MRN: [id number] Visit Date: 12/24/2019              Requested by: Claiborne Rigg, NP [address] [place],  [place] [address] PCP: Claiborne Rigg, NP   Assessment & Plan: Visit Diagnoses:  1. Dupuytren's contracture of left hand     Plan: Impression is left small finger Dupuytren's contracture.  I explained to him that we will have a hand surgeon available at our office in about 8 to 9 months if he is willing to wait for his arrival.  His Medicare starts in April and so his options for seeing a hand surgeon are vastly improved once he has Medicare but he states that he would like to return back to the this office to see our new hand surgeon who we will expect around August.  He will follow up with [place] at that time.  Follow-Up Instructions: Return if symptoms worsen or fail to improve.   Orders:  No orders of the defined types were placed in this encounter.  No orders of the defined types were placed in this encounter.     Procedures: No procedures performed   Clinical Data: No additional findings.   Subjective: Chief Complaint  Patient presents with  . Left Hand - Pain    Tyrone Nielsen is a 64 year old gentleman here for evaluation of chronic left Dupuytren's contracture of the small finger.  He has noticed that this has progressively gotten worse over the last 20 years.  Denies any injuries.  He works in [employer].  He is right-hand dominant.  He has learned to live with it to a certain extent.  He denies any pain.  He is interested in having this corrected.   Review of Systems  Constitutional: Negative.   All other systems reviewed and are negative.    Objective: Vital Signs: Ht 5\' 9"  (1.753 m)   Wt 180 lb (81.6 kg)   BMI 26.58 kg/m   Physical Exam Vitals and nursing note reviewed.  Constitutional:      Appearance: He is well-developed and well-nourished.  HENT:      Head: Normocephalic and atraumatic.  Eyes:     Pupils: Pupils are equal, round, and reactive to light.  Pulmonary:     Effort: Pulmonary effort is normal.  Abdominal:     Palpations: Abdomen is soft.  Musculoskeletal:        General: Normal range of motion.     Cervical back: Neck supple.  Skin:    General: Skin is warm.  Neurological:     Mental Status: He is alert and oriented to person, place, and time.  Psychiatric:        Mood and Affect: Mood and affect normal.        Behavior: Behavior normal.        Thought Content: Thought content normal.        Judgment: Judgment normal.     Ortho Exam Left hand shows a Dupuytren's contracture of the little finger.  No neurovascular compromise.  The MP joint is contracted to about 90 degrees as well as the PIP joint to 90 degrees.  20 degrees of flexion contracture of the DIP joint. Specialty Comments:  No specialty comments available.  Imaging: No results found.   PMFS History: Patient Active  Problem List   Diagnosis Date Noted  . Dupuytren's contracture of left hand 12/24/2019  . Recurrent major depressive disorder, in remission (HCC) 10/28/2019  . MDD (major depressive disorder) 07/29/2019  . Generalized anxiety disorder 07/29/2019  . Opioid dependence (HCC) 07/29/2019  . Acute alcoholism (HCC) 07/29/2019  . Cocaine dependence, continuous (HCC) 07/29/2019  . Chronic hepatitis C without hepatic coma (HCC) 05/06/2018   Past Medical History:  Diagnosis Date  . Anxiety   . Depression   . Hepatitis C     Family History  Problem Relation Age of Onset  . Hypertension Neg Hx   . Intellectual disability Neg Hx     History reviewed. No pertinent surgical history. Social History   Occupational History  . Occupation: Retired    Comment: Contstruction  Tobacco Use  . Smoking status: Former Games developer  . Smokeless tobacco: Never Used  Vaping Use  . Vaping Use: Never used  Substance and Sexual Activity  . Alcohol use:  Not Currently    Comment: Prior heavy alcohol use, 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  . Drug use: Not Currently  . Sexual activity: Not Currently

## 2020-01-13 MED FILL — LATUDA 60 MG TABLET: 60 | 30 days supply | Qty: 30 | Fill #0

## 2020-01-13 MED FILL — ?BUSPIRONE HCL 15MG TABS: 15 | 30 days supply | Qty: 60 | Fill #1

## 2020-01-13 MED FILL — BUPROPION SR 150 MG TABLET: 150 | 30 days supply | Qty: 60 | Fill #1

## 2020-01-18 ENCOUNTER — Other Ambulatory Visit: Payer: Self-pay | Admitting: Family Medicine

## 2020-01-18 ENCOUNTER — Encounter: Payer: Self-pay | Admitting: Pharmacist

## 2020-01-18 ENCOUNTER — Ambulatory Visit: Payer: Self-pay | Attending: Nurse Practitioner | Admitting: Pharmacist

## 2020-01-18 ENCOUNTER — Other Ambulatory Visit: Payer: Self-pay

## 2020-01-18 VITALS — BP 150/95 | HR 62

## 2020-01-18 DIAGNOSIS — I10 Essential (primary) hypertension: Secondary | ICD-10-CM

## 2020-01-18 MED ORDER — AMLODIPINE BESYLATE 10 MG PO TABS: 10.0000 mg | ORAL_TABLET | Freq: Every day | ORAL | 2 refills | Status: DC

## 2020-01-18 MED FILL — AMLODIPINE BESYLATE 10 MG T: 10 | 30 days supply | Qty: 30 | Fill #0

## 2020-01-18 MED FILL — ?ATORVASTATIN 10 MG TABLET: 10 | 30 days supply | Qty: 30 | Fill #0

## 2020-01-18 NOTE — Progress Notes (Signed)
   S:    Patient arrives well and in good spirits. Presents to the clinic for hypertension evaluation, counseling, and management. Patient was referred and last seen by Primary Care Provider, Loreen Freud, on 12/15/19. Patient was referred for blood pressure recheck after two consecutive BP readings that were in HTN range.   Today, patient denies signs or symptoms of high blood pressure. Denies history of blood pressure related issues.   Current BP Medications include:  None  Antihypertensives tried in the past include: None   Dietary habits include: limits salt, does drink coffee Exercise habits include: tries to walk daily  Family / Social history: father had hypertension; patient is non-smoker, recovery of alcohol dependence x15 years   O:  Vitals:   01/18/20 1028  BP: (!) 150/95  Pulse: 62    Home BP readings: None  Last 3 Office BP readings: BP Readings from Last 3 Encounters:  01/18/20 (!) 150/95  12/15/19 (!) 148/101  05/06/18 (!) 154/94    BMET    Component Value Date/Time   NA 134 12/15/2019 1418   K 4.8 12/15/2019 1418   CL 95 (L) 12/15/2019 1418   CO2 25 12/15/2019 1418   GLUCOSE 89 12/15/2019 1418   GLUCOSE 73 06/24/2018 1055   BUN 10 12/15/2019 1418   CREATININE 0.81 12/15/2019 1418   CREATININE 1.10 06/24/2018 1055   CALCIUM 9.9 12/15/2019 1418   GFRNONAA 94 12/15/2019 1418   GFRNONAA 84 05/06/2018 1523   GFRAA 109 12/15/2019 1418   GFRAA 98 05/06/2018 1523    Renal function: CrCl cannot be calculated (Patient's most recent lab result is older than the maximum 21 days allowed.).  Clinical ASCVD: No  The 10-year ASCVD risk score Denman George DC Jr., et al., 2013) is: 20.4%   Values used to calculate the score:     Age: 65 years     Sex: Male     Is Non-Hispanic African American: No     Diabetic: No     Tobacco smoker: No     Systolic Blood Pressure: 150 mmHg     Is BP treated: Yes     HDL Cholesterol: 47 mg/dL     Total Cholesterol: 231  mg/dL    A/P: Hypertension diagnosed today currently uncontolled on current medications. BP Goal = < 130/80 mmHg. Blood pressure today elevated, providing three consecutive hypertensive readings. Of note, BP normalized to 122/82 after resting for ~10 minutes. As previously recommended by Bertram Denver, will start low dose amlodipine today. Patient is amenable to this. Patient was educated on lifestyle modifications.  -Started amlodipine 5 mg once daily.  -Counseled on lifestyle modifications for blood pressure control including reduced dietary sodium, increased exercise, adequate sleep.  Results reviewed and written information provided.   Total time in face-to-face counseling 15 minutes.   F/U Clinic Visit with pharmacy in 1 month.  Patient seen with Lari Linson.  Theodis Sato, PharmD PGY-1 [facility] Resident   01/18/2020 5:26 PM

## 2020-01-26 ENCOUNTER — Encounter: Payer: Self-pay | Admitting: Nurse Practitioner

## 2020-02-03 ENCOUNTER — Telehealth (INDEPENDENT_AMBULATORY_CARE_PROVIDER_SITE_OTHER): Payer: No Payment, Other | Admitting: Physician Assistant

## 2020-02-03 ENCOUNTER — Other Ambulatory Visit: Payer: Self-pay

## 2020-02-03 DIAGNOSIS — F3340 Major depressive disorder, recurrent, in remission, unspecified: Secondary | ICD-10-CM | POA: Diagnosis not present

## 2020-02-03 DIAGNOSIS — F411 Generalized anxiety disorder: Secondary | ICD-10-CM | POA: Diagnosis not present

## 2020-02-10 IMAGING — MR MR HEAD WO/W CM
11 of 13 series · 40 of 48 positions shown · IV contrast (Gadavist)
Comparison: Prior CT from earlier same day.

CLINICAL DATA: Initial evaluation for generalized weakness, falls,
speech changes.

EXAM:
MRI HEAD WITHOUT AND WITH CONTRAST
TECHNIQUE: Multiplanar, multiecho pulse sequences of the brain and surrounding
structures were obtained without and with intravenous contrast.
CONTRAST:  7 cc of Gadavist.

[Series 5: DWI · axial · 3.0mm · 0.88mm/px · z∈[-90,+47]mm · 9 of 100 slices shown (1 of 4)]
[im 1/100]
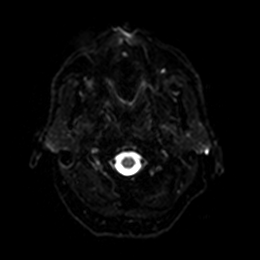
[im 13/100]
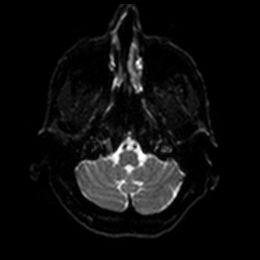
[im 25/100]
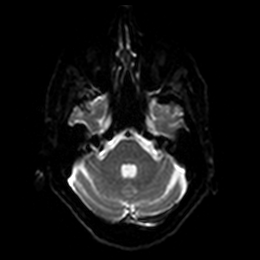
[im 38/100]
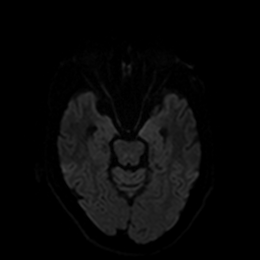
[im 50/100]
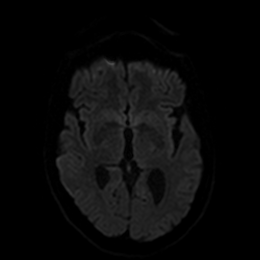
[im 62/100]
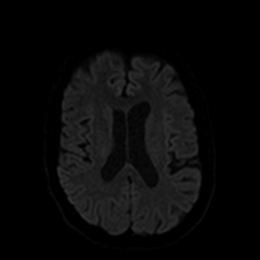
[im 75/100]
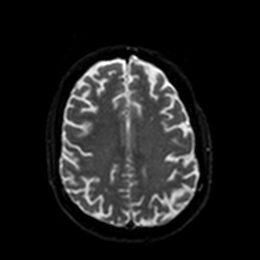
[im 87/100]
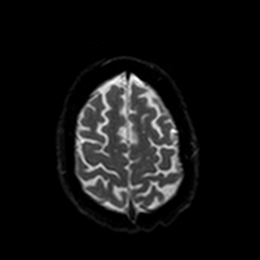
[im 100/100]
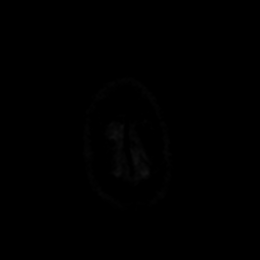

[Series 6: DWI · axial · 3.0mm · 0.88mm/px · z∈[-90,+47]mm · 5 of 50 slices shown (2 of 4)]
[im 1/50]
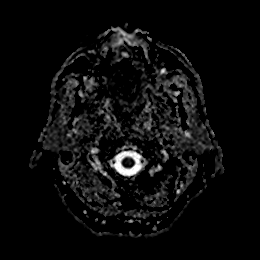
[im 13/50]
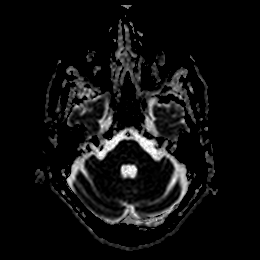
[im 25/50]
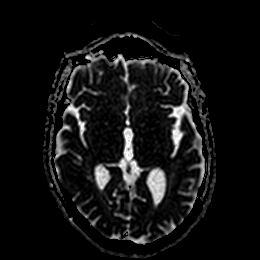
[im 37/50]
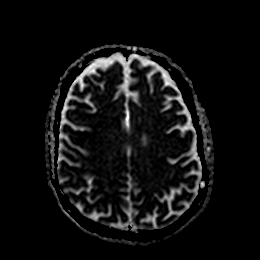
[im 50/50]
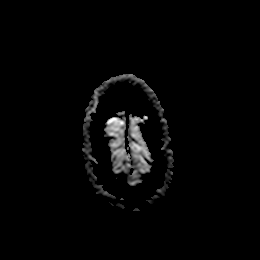

[Series 7: FLAIR · axial · 5.0mm · 0.45mm/px · z∈[-102,+49]mm · 2 of 28 slices shown]
[im 1/28]
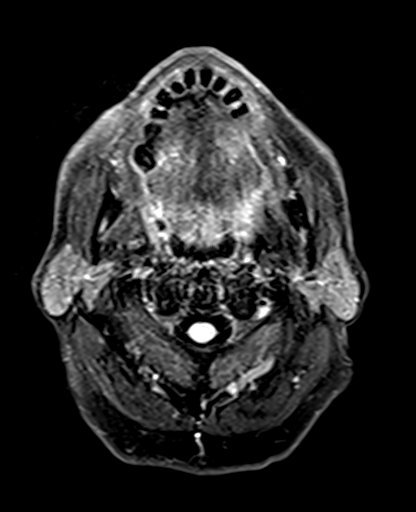
[im 28/28]
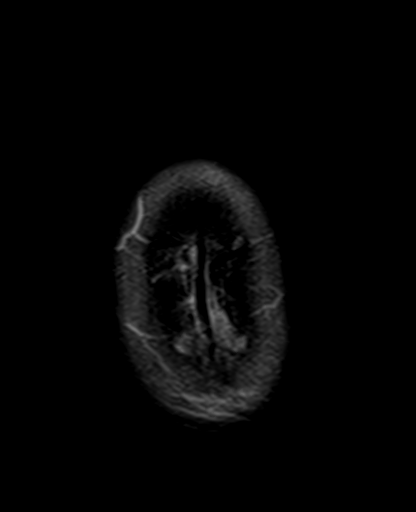

[Series 9: pha_images · axial · 3.0mm · 0.90mm/px · z∈[-90,+48]mm · 4 of 50 slices shown]
[im 1/50]
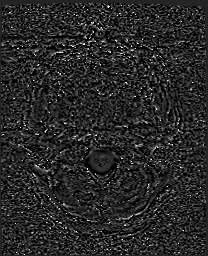
[im 17/50]
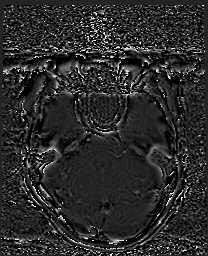
[im 33/50]
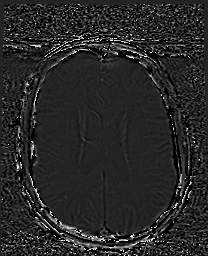
[im 50/50]
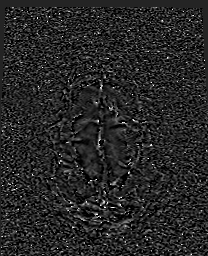

[Series 10: swi_images · axial · 3.0mm · 0.90mm/px · z∈[-93,+50]mm · 4 of 52 slices shown]
[im 1/52]
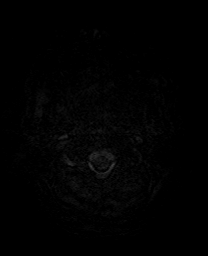
[im 18/52]
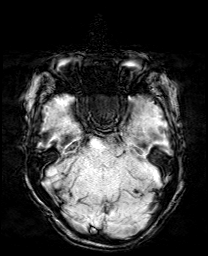
[im 35/52]
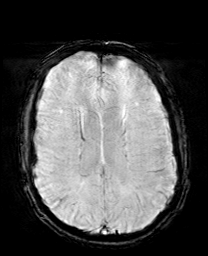
[im 52/52]
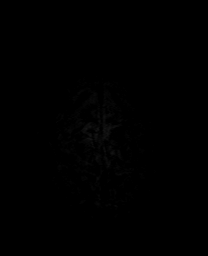

[Series 12: T2 · axial · 5.0mm · 0.72mm/px · z∈[-104,+48]mm · 2 of 28 slices shown]
[im 1/28]
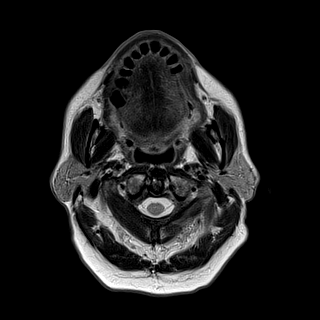
[im 28/28]
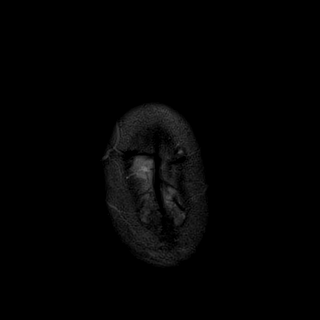

[Series 13: DWI · coronal · 4.0mm · 0.88mm/px · 5 of 64 slices shown (3 of 4)]
[im 1/64]
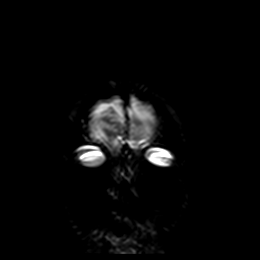
[im 16/64]
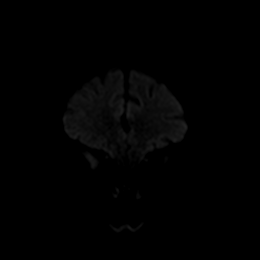
[im 32/64]
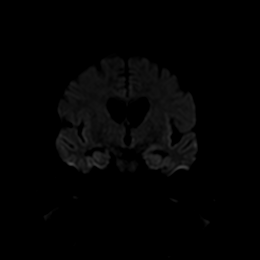
[im 48/64]
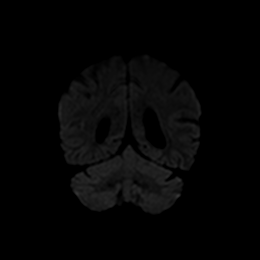
[im 64/64]
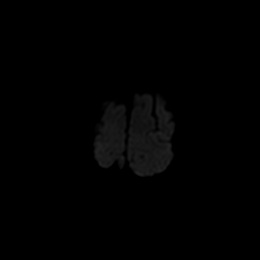

[Series 14: DWI · coronal · 4.0mm · 0.88mm/px · 3 of 32 slices shown (4 of 4)]
[im 1/32]
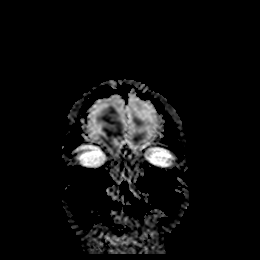
[im 16/32]
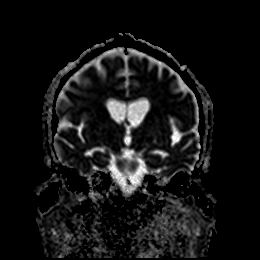
[im 32/32]
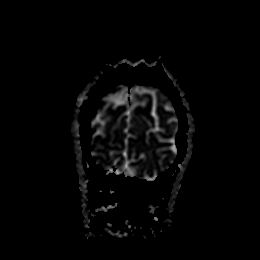

[Series 15: T1 · sagittal · 5.0mm · 0.75mm/px · 2 of 23 slices shown]
[im 1/23]
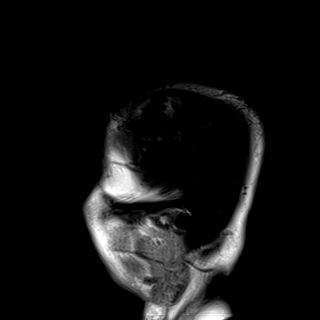
[im 23/23]
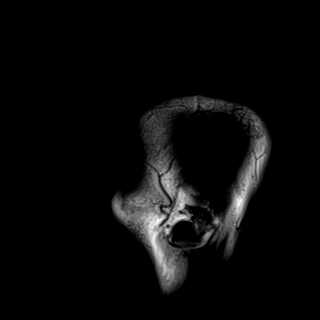

[Series 17: T2 post-contrast · coronal · 5.0mm · 0.72mm/px · 2 of 28 slices shown]
[im 1/28]
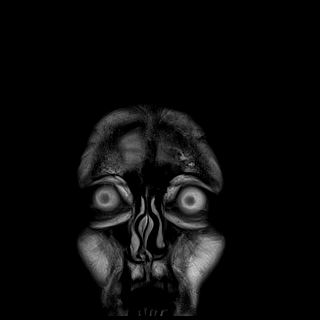
[im 28/28]
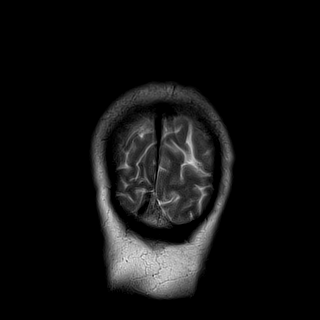

[Series 19: T1 post-contrast · coronal · 5.0mm · 0.34mm/px · 2 of 28 slices shown]
[im 1/28]
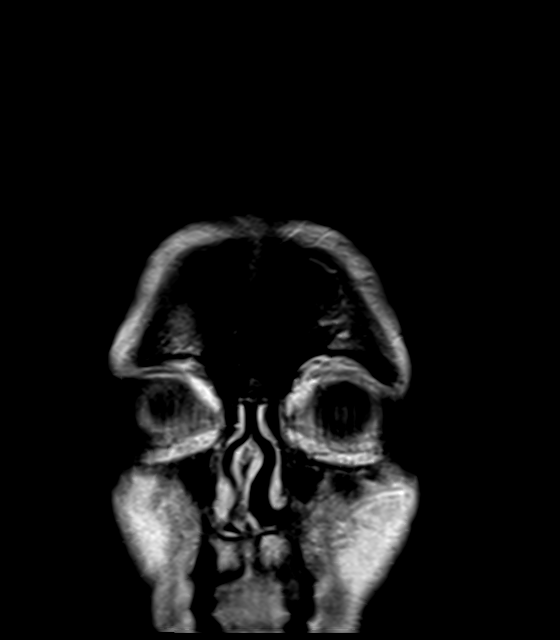
[im 28/28]
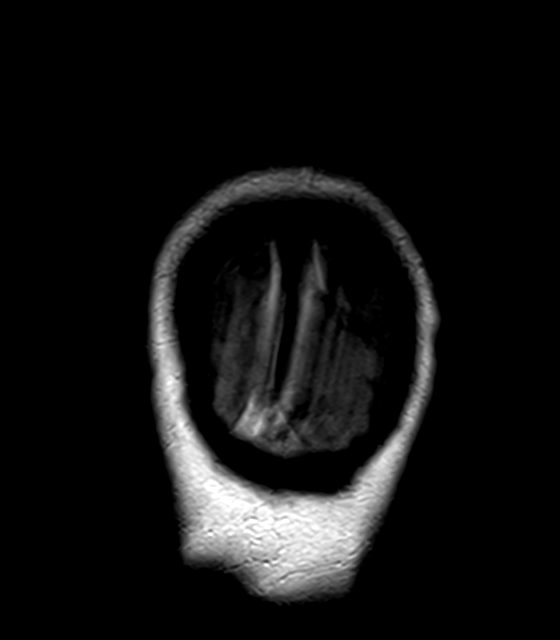

[40 of 48 positions shown; findings below may reference images not displayed]

FINDINGS: Brain: Generalized age appropriate cerebral atrophy. Mild scattered
patchy T2/FLAIR hyperintensity within the periventricular and deep
white matter both cerebral hemispheres, nonspecific, but most like
related chronic microvascular ischemic disease.

No abnormal foci of restricted diffusion to suggest acute or
subacute ischemia. Gray-white matter differentiation maintained. No
encephalomalacia to suggest chronic infarction. No foci of
susceptibility artifact to suggest acute or chronic intracranial
hemorrhage.

No mass lesion, midline shift or mass effect. No hydrocephalus. No
extra-axial fluid collection. Pituitary gland within normal limits.
Midline structures intact and normal. No abnormal enhancement.

Vascular: Major intracranial vascular flow voids are well
maintained.

Skull and upper cervical spine: Craniocervical junction within
normal limits. Upper cervical spine normal. Bone marrow signal
intensity within normal limits. No scalp soft tissue abnormality.

Sinuses/Orbits: Globes and orbital soft tissues within normal
limits. Paranasal sinuses are clear. Trace opacity bilateral mastoid
air cells, of doubtful significance. Inner ear structures normal.

Other: None.
IMPRESSION: 1. No acute intracranial abnormality.
2. Mild cerebral white matter changes for age, nonspecific, but most
likely related to chronic small vessel ischemic disease.

## 2020-02-14 ENCOUNTER — Telehealth (HOSPITAL_COMMUNITY): Payer: Self-pay | Admitting: *Deleted

## 2020-02-14 MED FILL — AMLODIPINE BESYLATE 10 MG T: 10 | 30 days supply | Qty: 30 | Fill #1

## 2020-02-14 MED FILL — ?BUSPIRONE HCL 15MG TABS: 15 | 30 days supply | Qty: 60 | Fill #2

## 2020-02-14 MED FILL — BUPROPION SR 150 MG TABLET: 150 | 30 days supply | Qty: 60 | Fill #2

## 2020-02-14 MED FILL — ?ATORVASTATIN 10 MG TABLET: 10 | 30 days supply | Qty: 30 | Fill #1

## 2020-02-14 NOTE — Telephone Encounter (Signed)
Received a request for a pa to be done for this patients Latuda. Referred my to contact [employer] which until now I have not been familiar with. Called them and they transferred me to [facility] and everything was taken care of. [facility] manages patient assistance for [place] and our patients, unclear about this request but told by [employer] people it was OK and he was approved for his 30 day supply.

## 2020-02-15 MED FILL — LATUDA 60 MG TABLET: 60 | 30 days supply | Qty: 30 | Fill #1

## 2020-02-16 NOTE — Progress Notes (Signed)
BH MD/PA/NP OP Progress Note  Virtual Visit via Telephone Note  I connected with Tyrone Nielsen on 02/03/2020 at 11:00 AM EST by telephone and verified that I am speaking with the correct person using two identifiers.  Location: Patient: Home Provider: Clinic   I discussed the limitations, risks, security and privacy concerns of performing an evaluation and management service by telephone and the availability of in person appointments. I also discussed with the patient that there may be a patient responsible charge related to this service. The patient expressed understanding and agreed to proceed.  Follow Up Instructions:    I discussed the assessment and treatment plan with the patient. The patient was provided an opportunity to ask questions and all were answered. The patient agreed with the plan and demonstrated an understanding of the instructions.   The patient was advised to call back or seek an in-person evaluation if the symptoms worsen or if the condition fails to improve as anticipated.  I provided 21 minutes of non-face-to-face time during this encounter.   Meta Hatchet, PA   02/03/2020 11:57 PM Tyrone Nielsen  MRN:  [id number]  Chief Complaint: Follow up and medication management  HPI:  Tyrone Nielsen is a 65 year old, male with a past psych history significant for major depressive disorder, generalized anxiety disorder, opioid dependence, and cocaine dependence who presents to [facility] via virtual telephone visit for follow up and medication management. Patient is currently being managed on the following medications:  Lurasidone (Latuda) 60 mg 1 x daily every evening Buspirone (Buspar) 15 mg 2 x daily  Bupropion  (Wellbutrin) 150 mg 12 hr tablet 2 times daily  Patient reports that his medication regimen has been going well.  Patient reports that all his issues are being managed appropriately.  Patient has no issues or  concerns regarding his medications.  Patient reports that there has been a lot of dead time lately and that his holidays were a bit of a bust.  Patient reports that he misses his daughter and granddaughter.  Patient reports that his mood has been pretty good.  Patient denies suicidal and homicidal ideations.  He further denies auditory or visual hallucinations.  Patient reports that he does not think that he is getting enough sleep.  Patient states no matter how much sleep he gets, he will always wake up at 6:00 AM.  Patient reports that his appetite fluctuates day to day. Patient reports that he will at least eat supper each day. Patient denies alcohol consumption (15 years sober), tobacco use, and illicit drug use (15 years sober).  Visit Diagnosis: No diagnosis found.  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  Family Psychiatric History:  Unknown  Family History:  Family History  Problem Relation Age of Onset   Hypertension Neg Hx    Intellectual disability Neg Hx     Social History:  Social History   Socioeconomic History   Marital status: Divorced    Spouse name: Not on file   Number of children: 1   Years of education: 12   Highest education level: Not on file  Occupational History   Occupation: Retired    Comment: Contstruction  Tobacco Use  Smoking status: Former Smoker   Smokeless tobacco: Never Used  Building services engineer Use: Never used  Substance and Sexual Activity   Alcohol use: Not Currently    Comment: Prior heavy alcohol use, 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   Drug use: Not Currently   Sexual activity: Not Currently  Other Topics Concern   Not on file  Social History Narrative    Not on file   Social Determinants of Health   Financial Resource Strain: Low Risk    Difficulty of Paying Living Expenses: Not hard at all  Food Insecurity: No Food Insecurity   Worried About Programme researcher, broadcasting/film/video in the Last Year: Never true   Ran Out of Food in the Last Year: Never true  Transportation Needs: No Transportation Needs   Lack of Transportation (Medical): No   Lack of Transportation (Non-Medical): No  Physical Activity: Sufficiently Active   Days of Exercise per Week: 7 days   Minutes of Exercise per Session: 60 min  Stress: Stress Concern Present   Feeling of Stress : To some extent  Social Connections: Moderately Isolated   Frequency of Communication with Friends and Family: Three times a week   Frequency of Social Gatherings with Friends and Family: Twice a week   Attends Religious Services: Never   Database administrator or Organizations: No   Attends Engineer, structural: 1 to 4 times per year   Marital Status: Divorced    Allergies: No Known Allergies  Metabolic Disorder Labs: Lab Results  Component Value Date   HGBA1C 5.3 12/15/2019   No results found for: PROLACTIN Lab Results  Component Value Date   CHOL 231 (H) 12/15/2019   TRIG 136 12/15/2019   HDL 47 12/15/2019   CHOLHDL 4.9 12/15/2019   LDLCALC 159 (H) 12/15/2019   Lab Results  Component Value Date   TSH 3.710 03/13/2018   TSH 4.860 (H) 02/23/2018    Therapeutic Level Labs: No results found for: LITHIUM No results found for: VALPROATE No components found for:  CBMZ  Current Medications: Current Outpatient Medications  Medication Sig Dispense Refill   acetaminophen (TYLENOL) 500 MG tablet Take 500 mg by mouth every 6 (six) hours as needed for mild pain.     amLODipine (NORVASC) 10 MG tablet Take 1 tablet (10 mg total) by mouth daily. 30 tablet 2   atorvastatin (LIPITOR) 10 MG tablet Take 1 tablet (10 mg total) by mouth daily. 90 tablet 3   buPROPion  (WELLBUTRIN SR) 150 MG 12 hr tablet Take 1 tablet (150 mg total) by mouth 2 (two) times daily. 60 tablet 2   busPIRone (BUSPAR) 15 MG tablet Take 1 tablet (15 mg total) by mouth 2 (two) times daily. 60 tablet 2   Glecaprevir-Pibrentasvir (MAVYRET) 100-40 MG TABS Take 3 tablets by mouth daily with breakfast. 84 tablet 1   Lurasidone HCl (LATUDA) 60 MG TABS Take 1 tablet (60 mg total) by mouth every evening. 30 tablet 2   Multiple Vitamin (MULTIVITAMIN WITH MINERALS) TABS tablet Take 1 tablet by mouth daily.     No current facility-administered medications for this visit.     Musculoskeletal: Strength & Muscle Tone: Unable to assess due to telemedicine visit Gait & Station: Unable to assess due to telemedicine visit Patient leans: Unable to assess due to telemedicine visit  Psychiatric Specialty Exam: Review of Systems  Psychiatric/Behavioral: Negative for decreased concentration, dysphoric mood, hallucinations, self-injury and suicidal ideas. Sleep disturbance: Patient reports that he feels  he is not getting enough sleep. The patient is not nervous/anxious and is not hyperactive.     There were no vitals taken for this visit.There is no height or weight on file to calculate BMI.  General Appearance: Unable to assess due to telemedicine visit  Eye Contact:  Unable to assess due to telemedicine visit  Speech:  Clear and Coherent and Normal Rate  Volume:  Normal  Mood:  Euthymic  Affect:  Appropriate  Thought Process:  Coherent, Goal Directed and Descriptions of Associations: Intact  Orientation:  Full (Time, Place, and Person)  Thought Content: WDL and Logical   Suicidal Thoughts:  No  Homicidal Thoughts:  No  Memory:  Immediate;   Good Recent;   Good Remote;   Good  Judgement:  Good  Insight:  Good  Psychomotor Activity:  Normal  Concentration:  Concentration: Good and Attention Span: Good  Recall:  Good  Fund of Knowledge: Good  Language: Good  Akathisia:  NA  Handed:   Right  AIMS (if indicated): not done  Assets:  Communication Skills Desire for Improvement Housing Physical Health Social Support  ADL's:  Intact  Cognition: WNL  Sleep:  Fair   Screenings: GAD-7   Flowsheet Row Office Visit from 12/15/2019 in [facility] Office Visit from 03/23/2018 in [facility]  Total GAD-7 Score 1 12    PHQ2-9   Flowsheet Row Office Visit from 12/15/2019 in [facility] Counselor from 07/22/2019 in [facility] Office Visit from 05/06/2018 in [facility] Office Visit from 03/23/2018 in [facility]  PHQ-2 Total Score 3 3 0 3  PHQ-9 Total Score 4 6 -- 16       Assessment and Plan:  Tyrone Nielsen is a 65 year old, male with a past psych history significant for major depressive disorder, generalized anxiety disorder, opioid dependence, and cocaine dependence who presents to [facility] via virtual telephone visit for follow up and medication management.  Patient reports that his psychiatric conditions are being managed appropriately by his current medication regimen.  Patient denies any issues or concerns at this time.  Patient denies the need for refills at this time.  1. Generalized anxiety disorder Patient to continue taking buspirone 15 mg 2 times daily as prescribed  2. Recurrent major depressive disorder, in remission [facility]) Patient to continue taking bupropion 150 mg 12-hour tablet 2 times daily as prescribed Patient to continue taking lurasidone 60 mg daily as prescribed  Patient to follow up in 2 months  Meta Hatchet, PA 02/03/2020, 11:57 PM

## 2020-02-17 ENCOUNTER — Encounter (HOSPITAL_COMMUNITY): Payer: Self-pay | Admitting: Physician Assistant

## 2020-02-18 ENCOUNTER — Encounter: Payer: Self-pay | Admitting: Pharmacist

## 2020-02-18 ENCOUNTER — Ambulatory Visit: Payer: Self-pay | Attending: Nurse Practitioner | Admitting: Pharmacist

## 2020-02-18 ENCOUNTER — Other Ambulatory Visit: Payer: Self-pay

## 2020-02-18 VITALS — BP 134/87

## 2020-02-18 DIAGNOSIS — I10 Essential (primary) hypertension: Secondary | ICD-10-CM

## 2020-02-18 NOTE — Progress Notes (Signed)
   S:    PCP: Zelda  Patient arrives in good spirits. Presents to the clinic for hypertension evaluation, counseling, and management. Patient was referred and last seen by Primary Care Provider, Loreen Freud, on 12/15/19. Pharmacy saw him 01/18/2020 and started amlodipine.   Today, patient denies HA, blurred vision, chest pain, or dyspnea. Pt reports adherence to medication.   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   O:  Vitals:   02/18/20 1023  BP: 134/87   Home BP readings: None  Last 3 Office BP readings: BP Readings from Last 3 Encounters:  02/18/20 134/87  01/18/20 (!) 150/95  12/15/19 (!) 148/101    BMET    Component Value Date/Time   NA 134 12/15/2019 1418   K 4.8 12/15/2019 1418   CL 95 (L) 12/15/2019 1418   CO2 25 12/15/2019 1418   GLUCOSE 89 12/15/2019 1418   GLUCOSE 73 06/24/2018 1055   BUN 10 12/15/2019 1418   CREATININE 0.81 12/15/2019 1418   CREATININE 1.10 06/24/2018 1055   CALCIUM 9.9 12/15/2019 1418   GFRNONAA 94 12/15/2019 1418   GFRNONAA 84 05/06/2018 1523   GFRAA 109 12/15/2019 1418   GFRAA 98 05/06/2018 1523    Renal function: CrCl cannot be calculated (Patient's most recent lab result is older than the maximum 21 days allowed.).  Clinical ASCVD: No  The 10-year ASCVD risk score Denman George DC Jr., et al., 2013) is: 17%   Values used to calculate the score:     Age: 65 years     Sex: Male     Is Non-Hispanic African American: No     Diabetic: No     Tobacco smoker: No     Systolic Blood Pressure: 134 mmHg     Is BP treated: Yes     HDL Cholesterol: 47 mg/dL     Total Cholesterol: 231  mg/dL    A/P: Hypertension currently above goal but improving on amlodipine. BP Goal = < 130/80 mmHg. Pt encouraged to check blood pressure at home and decrease coffee intake. No changes today given improvement.  -Continue amlodipine 10 mg daily. -Counseled on lifestyle modifications for blood pressure control including reduced dietary sodium, increased exercise, adequate sleep.  Results reviewed and written information provided.   Total time in face-to-face counseling 15 minutes.   F/U Clinic Visit with pharmacy in 1 month.    Patient seen with:  Adam Phenix, PharmD Candidate [employer]  Class of 2024  Butch Penny, PharmD, [place], CPP Clinical Pharmacist [facility] [phone]

## 2020-03-13 ENCOUNTER — Other Ambulatory Visit: Payer: Self-pay | Admitting: Psychiatry

## 2020-03-13 DIAGNOSIS — F3340 Major depressive disorder, recurrent, in remission, unspecified: Secondary | ICD-10-CM

## 2020-03-13 MED FILL — ?ATORVASTATIN 10 MG TABLET: 10 | 30 days supply | Qty: 30 | Fill #2

## 2020-03-13 MED FILL — AMLODIPINE BESYLATE 10 MG T: 10 | 30 days supply | Qty: 30 | Fill #2

## 2020-03-13 MED FILL — ?BUSPIRONE HCL 15MG TABS: 15 | 30 days supply | Qty: 60 | Fill #1

## 2020-03-13 MED FILL — LATUDA 60 MG TABLET: 60 | 30 days supply | Qty: 30 | Fill #0

## 2020-03-13 MED FILL — BUPROPION HCL ER (SMOKING D: 150 | 30 days supply | Qty: 60 | Fill #0

## 2020-03-17 ENCOUNTER — Other Ambulatory Visit: Payer: Self-pay

## 2020-03-17 ENCOUNTER — Ambulatory Visit: Payer: Self-pay | Attending: Nurse Practitioner | Admitting: Pharmacist

## 2020-03-17 VITALS — BP 116/76 | HR 61

## 2020-03-17 DIAGNOSIS — I10 Essential (primary) hypertension: Secondary | ICD-10-CM

## 2020-03-17 NOTE — Progress Notes (Signed)
   S:    PCP: Zelda  Patient arrives in good spirits. Presents to the clinic for hypertension evaluation, counseling, and management. Patient was referred and last seen by Primary Care Provider, Loreen Freud, on 12/15/19. Pharmacy saw him 02/18/2020 and made no changes.   Today, patient denies HA, blurred vision, chest pain, or dyspnea. Pt reports adherence to medication.   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   O:  Vitals:   03/17/20 1631  BP: 116/76  Pulse: 61   Home BP readings: None  Last 3 Office BP readings: BP Readings from Last 3 Encounters:  03/17/20 116/76  02/18/20 134/87  01/18/20 (!) 150/95    BMET    Component Value Date/Time   NA 134 12/15/2019 1418   K 4.8 12/15/2019 1418   CL 95 (L) 12/15/2019 1418   CO2 25 12/15/2019 1418   GLUCOSE 89 12/15/2019 1418   GLUCOSE 73 06/24/2018 1055   BUN 10 12/15/2019 1418   CREATININE 0.81 12/15/2019 1418   CREATININE 1.10 06/24/2018 1055   CALCIUM 9.9 12/15/2019 1418   GFRNONAA 94 12/15/2019 1418   GFRNONAA 84 05/06/2018 1523   GFRAA 109 12/15/2019 1418   GFRAA 98 05/06/2018 1523    Renal function: CrCl cannot be calculated (Patient's most recent lab result is older than the maximum 21 days allowed.).  Clinical ASCVD: No  The 10-year ASCVD risk score Denman George DC Jr., et al., 2013) is: 13.4%   Values used to calculate the score:     Age: 65 years     Sex: Male     Is Non-Hispanic African American: No     Diabetic: No     Tobacco smoker: No     Systolic Blood Pressure: 116 mmHg     Is BP treated: Yes     HDL Cholesterol: 47 mg/dL     Total Cholesterol: 231  mg/dL    A/P: Hypertension currently at goal but improving on amlodipine. BP Goal = < 130/80 mmHg. Pt encouraged to check blood pressure at home and decrease coffee intake. No changes today.  -Continue amlodipine 10 mg daily. -Counseled on lifestyle modifications for blood pressure control including reduced dietary sodium, increased exercise, adequate sleep.  Results reviewed and written information provided.   Total time in face-to-face counseling 15 minutes.   F/U Clinic Visit with pharmacy in 1 month.    Butch Penny, PharmD, [place]y [place], CPP Clinical Pharmacist [facility] [phone]

## 2020-03-30 ENCOUNTER — Telehealth (INDEPENDENT_AMBULATORY_CARE_PROVIDER_SITE_OTHER): Payer: Medicare Other | Admitting: Physician Assistant

## 2020-03-30 ENCOUNTER — Other Ambulatory Visit (HOSPITAL_COMMUNITY): Payer: Self-pay | Admitting: Physician Assistant

## 2020-03-30 ENCOUNTER — Encounter (HOSPITAL_COMMUNITY): Payer: Self-pay | Admitting: Physician Assistant

## 2020-03-30 ENCOUNTER — Other Ambulatory Visit: Payer: Self-pay

## 2020-03-30 DIAGNOSIS — F3340 Major depressive disorder, recurrent, in remission, unspecified: Secondary | ICD-10-CM | POA: Diagnosis not present

## 2020-03-30 DIAGNOSIS — F411 Generalized anxiety disorder: Secondary | ICD-10-CM | POA: Diagnosis not present

## 2020-03-30 MED ORDER — BUPROPION HCL ER (SR) 150 MG PO TB12: 150.0000 mg | ORAL_TABLET | Freq: Two times a day (BID) | ORAL | 2 refills | Status: DC

## 2020-03-30 MED ORDER — BUSPIRONE HCL 15 MG PO TABS: 15.0000 mg | ORAL_TABLET | Freq: Two times a day (BID) | ORAL | 2 refills | Status: DC

## 2020-03-30 MED ORDER — LATUDA 60 MG PO TABS: 1.0000 | ORAL_TABLET | Freq: Every evening | ORAL | 2 refills | Status: DC

## 2020-03-30 NOTE — Progress Notes (Signed)
BH MD/PA/NP OP Progress Note  Virtual Visit via Video Note  I connected with Tyrone Nielsen on 03/30/20 at 11:00 AM EDT by a video enabled telemedicine application and verified that I am speaking with the correct person using two identifiers.  Location: Patient: Home Provider: Clinic   I discussed the limitations of evaluation and management by telemedicine and the availability of in person appointments. The patient expressed understanding and agreed to proceed.  Follow Up Instructions:  I discussed the assessment and treatment plan with the patient. The patient was provided an opportunity to ask questions and all were answered. The patient agreed with the plan and demonstrated an understanding of the instructions.   The patient was advised to call back or seek an in-person evaluation if the symptoms worsen or if the condition fails to improve as anticipated.  I provided 16 minutes of non-face-to-face time during this encounter.  Meta Hatchet, PA   03/30/2020 11:25 AM Tyrone Nielsen  MRN:  [id number]  Chief Complaint:  Follow up and medication management  HPI:   Tyrone Nielsen is a 65 year old male with a past psychiatric history significant for major depressive disorder and generalized anxiety disorder who presents to [facility] via virtual telephone visit for follow-up and medication management.  Patient is currently being managed on the following medications:  Buspirone 15 mg 2 times daily Bupropion 150 mg 12-hour tablet 2 times daily Lurasidone 60 mg daily  Patient has no issues or concerns regarding his current medication regimen.  Patient denies the need for dosage adjustments at this time and states that it has taken several years to get his medications just right to where it is managing his symptoms.  Patient is requesting refills on his medications following the conclusion of the encounter.  Patient denies any new stressors and  states that there is not much deviation from his day-to-day routine.  Patient is pleasant, calm, cooperative, and fully engaged in conversation during the encounter.  Patient reports that his mood is average and that he feels somewhat melancholy.  Patient denies suicidal or homicidal ideations.  He further denies auditory or visual hallucinations and does not appear to be acting on external/internal stimuli.  Patient reports that his sleep schedule is somewhat complicated.  Patient reports that he often wakes up at 5:30 AM but finds himself staying up till 1 AM.  Patient attributes his erratic sleep schedule to taking naps during the evening.  Patient endorses good appetite and eats on average 3 meals per day.  Patient denies alcohol consumption, tobacco use, and illicit drug use.  Visit Diagnosis:    ICD-10-CM   1. Generalized anxiety disorder  F41.1 busPIRone (BUSPAR) 15 MG tablet  2. 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 Anxiety   . Depression   . Hepatitis C    No past surgical history on file.  Family Psychiatric History:  Unknown  Family History:  Family History  Problem Relation Age of Onset  . Hypertension Neg Hx   . Intellectual disability Neg Hx     Social History:  Social History   Socioeconomic History  . Marital status: Divorced  Spouse name: Not on file  . Number of children: 1  . Years of education: 73  . Highest education level: Not on file  Occupational History  . Occupation: Retired    Comment: Contstruction  Tobacco Use  . Smoking status: Former Games developer  . Smokeless tobacco: Never Used  Vaping Use   . Vaping Use: Never used  Substance and Sexual Activity  . 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 Drug use: Not Currently  . Sexual activity: Not Currently  Other Topics Concern  . Not on file  Social History Narrative  . Not on file   Social Determinants of Health   Financial Resource Strain: Low Risk   . Difficulty of Paying Living Expenses: Not hard at all  Food Insecurity: No Food Insecurity  . Worried About Programme researcher, broadcasting/film/video in the Last Year: Never true  . Ran Out of Food in the Last Year: Never true  Transportation Needs: No Transportation Needs  . Lack of Transportation (Medical): No  . Lack of Transportation (Non-Medical): No  Physical Activity: Sufficiently Active  . Days of Exercise per Week: 7 days  . Minutes of Exercise per Session: 60 min  Stress: Stress Concern Present  . Feeling of Stress : To some extent  Social Connections: Moderately Isolated  . Frequency of Communication with Friends and Family: Three times a week  . Frequency of Social Gatherings with Friends and Family: Twice a week  . Attends Religious Services: Never  . Active Member of Clubs or Organizations: No  . Attends Banker Meetings: 1 to 4 times per year  . Marital Status: Divorced    Allergies: No Known Allergies  Metabolic Disorder Labs: Lab Results  Component Value Date   HGBA1C 5.3 12/15/2019   No results found for: PROLACTIN Lab Results  Component Value Date   CHOL 231 (H) 12/15/2019   TRIG 136 12/15/2019   HDL 47 12/15/2019   CHOLHDL 4.9 12/15/2019   LDLCALC 159 (H) 12/15/2019   Lab Results  Component Value Date   TSH 3.710 03/13/2018   TSH 4.860 (H) 02/23/2018    Therapeutic Level Labs: No results found for: LITHIUM No results found for: VALPROATE No components found for:  CBMZ  Current Medications: Current Outpatient Medications  Medication Sig Dispense Refill  . acetaminophen (TYLENOL) 500 MG tablet  Take 500 mg by mouth every 6 (six) hours as needed for mild pain.    Marland Kitchen amLODipine (NORVASC) 10 MG tablet Take 1 tablet (10 mg total) by mouth daily. 30 tablet 2  . atorvastatin (LIPITOR) 10 MG tablet Take 1 tablet (10 mg total) by mouth daily. 90 tablet 3  . buPROPion (WELLBUTRIN SR) 150 MG 12 hr tablet Take 1 tablet (150 mg total) by mouth 2 (two) times daily. 60 tablet 2  . busPIRone (BUSPAR) 15 MG tablet Take 1 tablet (15 mg total) by mouth 2 (two) times daily. 60 tablet 2  . Glecaprevir-Pibrentasvir (MAVYRET) 100-40 MG TABS Take 3 tablets by mouth daily with breakfast. 84 tablet 1  . Lurasidone HCl (LATUDA) 60 MG TABS Take 1 tablet (60 mg total) by mouth every evening. 30 tablet 2  . Multiple Vitamin (MULTIVITAMIN WITH MINERALS) TABS tablet Take 1 tablet by mouth daily.     No current facility-administered medications for this visit.     Musculoskeletal: Strength & Muscle Tone: Unable to assess due to telemedicine visit Gait & Station:  Unable to assess due to telemedicine visit Patient leans:  Unable to assess due to telemedicine visit  Psychiatric Specialty Exam: Review of Systems  Psychiatric/Behavioral: Negative for agitation, decreased concentration, dysphoric mood, hallucinations, self-injury, sleep disturbance and suicidal ideas. The patient is nervous/anxious. The patient is not hyperactive.     There were no vitals taken for this visit.There is no height or weight on file to calculate BMI.  General Appearance: Fairly Groomed  Eye Contact:  Good  Speech:  Clear and Coherent and Normal Rate  Volume:  Normal  Mood:  Anxious and Euthymic  Affect:  Appropriate and Congruent  Thought Process:  Coherent and Descriptions of Associations: Intact  Orientation:  Full (Time, Place, and Person)  Thought Content: WDL and Logical   Suicidal Thoughts:  No  Homicidal Thoughts:  No  Memory:  Immediate;   Good Recent;   Good Remote;   Good  Judgement:  Good  Insight:  Good   Psychomotor Activity:  Normal  Concentration:  Concentration: Good and Attention Span: Good  Recall:  Good  Fund of Knowledge: Good  Language: Good  Akathisia:  NA  Handed:  Right  AIMS (if indicated): not done  Assets:  Communication Skills Desire for Improvement Housing Physical Health Social Support  ADL's:  Intact  Cognition: WNL  Sleep:  Fair   Screenings: GAD-7   Flowsheet Row Video Visit from 03/30/2020 in [facility] Office Visit from 12/15/2019 in [facility] Office Visit from 03/23/2018 in [facility]  Total GAD-7 Score 5 1 12     PHQ2-9   Flowsheet Row Video Visit from 03/30/2020 in [facility] Office Visit from 12/15/2019 in [facility] Counselor from 07/22/2019 in [facility] Office Visit from 05/06/2018 in [facility] Office Visit from 03/23/2018 in [facility]  PHQ-2 Total Score 1 3 3  0 3  PHQ-9 Total Score -- 4 6 -- 16    Flowsheet Row Video Visit from 03/30/2020 in [facility]  C-SSRS RISK CATEGORY No Risk       Assessment and Plan:   Tyrone Nielsen is a 65 year old male with a past psychiatric history significant for major depressive disorder and generalized anxiety disorder who presents to [facility] via virtual telephone visit for follow-up and medication management.  Patient has no issues or concerns regarding his current medication regimen.  Patient denies the need for dosage adjustments at this time and is requesting refills on all his medications.  Patient denies any new stressors at this time.  Patient's medications will be e-prescribed to pharmacy of choice.  1. Generalized anxiety disorder  - busPIRone (BUSPAR) 15 MG tablet; Take 1 tablet (15 mg total)  by mouth 2 (two) times daily.  Dispense: 60 tablet; Refill: 2  2. Recurrent major depressive disorder, in remission (HCC)  - buPROPion (WELLBUTRIN SR) 150 MG 12 hr tablet; Take 1 tablet (150 mg total) by mouth 2 (two) times daily.  Dispense: 60 tablet; Refill: 2 - Lurasidone HCl (LATUDA) 60 MG TABS; Take 1 tablet (60 mg total) by mouth every evening.  Dispense: 30 tablet; Refill: 2  Patient to follow-up in 3 months  77, PA 03/30/2020, 11:25 AM

## 2020-04-11 ENCOUNTER — Other Ambulatory Visit: Payer: Self-pay | Admitting: Nurse Practitioner

## 2020-04-11 ENCOUNTER — Other Ambulatory Visit: Payer: Self-pay | Admitting: Family Medicine

## 2020-04-11 DIAGNOSIS — I10 Essential (primary) hypertension: Secondary | ICD-10-CM

## 2020-04-11 MED FILL — ?ATORVASTATIN 10 MG TABLET: 10 | 30 days supply | Qty: 30 | Fill #3

## 2020-04-11 MED FILL — ?BUSPIRONE HCL 15MG TABS: 15 | 30 days supply | Qty: 60 | Fill #0

## 2020-04-11 MED FILL — LATUDA 60 MG TABLET: 60 | 30 days supply | Qty: 30 | Fill #1

## 2020-04-11 MED FILL — AMLODIPINE BESYLATE 10 MG T: 10 | 30 days supply | Qty: 30 | Fill #0

## 2020-04-11 MED FILL — BUPROPION SR 150 MG TABLET: 150 | 30 days supply | Qty: 60 | Fill #0

## 2020-04-11 NOTE — Telephone Encounter (Signed)
Requested Prescriptions  Pending Prescriptions Disp Refills  . amLODipine (NORVASC) 10 MG tablet [Pharmacy Med Name: AMLODIPINE BESYLATE 10 MG T 10 Tablet] 90 tablet 0    Sig: TAKE 1 TABLET (10 MG TOTAL) BY MOUTH DAILY.     Cardiovascular:  Calcium Channel Blockers Passed - 04/11/2020  9:12 AM      Passed - Last BP in normal range    BP Readings from Last 1 Encounters:  03/17/20 116/76         Passed - Valid encounter within last 6 months    Recent Outpatient Visits          3 weeks ago Essential hypertension   [facility] Lois Huxley, Cornelius Moras, RPH-CPP   1 month ago Essential hypertension   [facility] Lois Huxley, Cornelius Moras, RPH-CPP   2 months ago Essential hypertension   [facility] Lois Huxley, Cornelius Moras, RPH-CPP   3 months ago Transaminitis   [facility] [place], Shea Stakes, NP   1 year ago Anxiety and depression   [facility] [place], Shea Stakes, NP

## 2020-04-15 ENCOUNTER — Other Ambulatory Visit: Payer: Self-pay

## 2020-05-11 ENCOUNTER — Other Ambulatory Visit: Payer: Self-pay

## 2020-05-11 MED FILL — Amlodipine Besylate Tab 10 MG (Base Equivalent): ORAL | 30 days supply | Qty: 30 | Fill #0 | Status: AC

## 2020-05-11 MED FILL — Buspirone HCl Tab 15 MG: ORAL | 30 days supply | Qty: 60 | Fill #0 | Status: AC

## 2020-05-11 MED FILL — Bupropion HCl Tab ER 12HR 150 MG: ORAL | 30 days supply | Qty: 60 | Fill #0 | Status: AC

## 2020-05-11 MED FILL — Atorvastatin Calcium Tab 10 MG (Base Equivalent): ORAL | 30 days supply | Qty: 30 | Fill #0 | Status: AC

## 2020-05-11 MED FILL — Lurasidone HCl Tab 60 MG: ORAL | 30 days supply | Qty: 30 | Fill #0 | Status: AC

## 2020-05-12 ENCOUNTER — Other Ambulatory Visit: Payer: Self-pay

## 2020-05-23 ENCOUNTER — Ambulatory Visit: Payer: Self-pay

## 2020-06-13 ENCOUNTER — Other Ambulatory Visit: Payer: Self-pay

## 2020-06-13 MED FILL — Lurasidone HCl Tab 60 MG: ORAL | 30 days supply | Qty: 30 | Fill #1 | Status: AC

## 2020-06-13 MED FILL — Amlodipine Besylate Tab 10 MG (Base Equivalent): ORAL | 30 days supply | Qty: 30 | Fill #1 | Status: AC

## 2020-06-13 MED FILL — Buspirone HCl Tab 15 MG: ORAL | 30 days supply | Qty: 60 | Fill #1 | Status: AC

## 2020-06-13 MED FILL — Atorvastatin Calcium Tab 10 MG (Base Equivalent): ORAL | 30 days supply | Qty: 30 | Fill #1 | Status: AC

## 2020-06-13 MED FILL — Bupropion HCl Tab ER 12HR 150 MG: ORAL | 30 days supply | Qty: 60 | Fill #1 | Status: AC

## 2020-06-14 ENCOUNTER — Other Ambulatory Visit: Payer: Self-pay

## 2020-06-16 ENCOUNTER — Other Ambulatory Visit: Payer: Self-pay

## 2020-06-30 ENCOUNTER — Telehealth (HOSPITAL_COMMUNITY): Payer: No Payment, Other | Admitting: Physician Assistant

## 2020-07-04 ENCOUNTER — Encounter (HOSPITAL_COMMUNITY): Payer: Self-pay | Admitting: Physician Assistant

## 2020-07-04 ENCOUNTER — Telehealth (INDEPENDENT_AMBULATORY_CARE_PROVIDER_SITE_OTHER): Payer: Medicare Other | Admitting: Physician Assistant

## 2020-07-04 DIAGNOSIS — F411 Generalized anxiety disorder: Secondary | ICD-10-CM | POA: Diagnosis not present

## 2020-07-04 DIAGNOSIS — F3340 Major depressive disorder, recurrent, in remission, unspecified: Secondary | ICD-10-CM

## 2020-07-04 MED ORDER — BUPROPION HCL ER (SR) 150 MG PO TB12
ORAL_TABLET | Freq: Two times a day (BID) | ORAL | 0 refills | Status: DC
  Filled 2020-07-04: qty 180, fill #0
  Filled 2020-07-10: qty 60, 30d supply, fill #0
  Filled 2020-08-10: qty 60, 30d supply, fill #1

## 2020-07-04 MED ORDER — BUSPIRONE HCL 15 MG PO TABS
ORAL_TABLET | ORAL | 0 refills | Status: DC
  Filled 2020-07-04: qty 180, fill #0
  Filled 2020-07-10: qty 60, 30d supply, fill #0
  Filled 2020-08-10: qty 60, 30d supply, fill #1

## 2020-07-04 MED ORDER — LURASIDONE HCL 60 MG PO TABS
ORAL_TABLET | ORAL | 0 refills | Status: DC
  Filled 2020-07-04: qty 90, fill #0
  Filled 2020-07-10: qty 30, 30d supply, fill #0
  Filled 2020-08-10: qty 30, 30d supply, fill #1

## 2020-07-04 NOTE — Progress Notes (Addendum)
BH MD/PA/NP OP Progress Note  Virtual Visit via Video Note  I connected with Tyrone Nielsen on 07/04/20 at  2:30 PM EDT by a video enabled telemedicine application and verified that I am speaking with the correct person using two identifiers.  Location: Patient: Home Provider: Clinic   I discussed the limitations of evaluation and management by telemedicine and the availability of in person appointments. The patient expressed understanding and agreed to proceed.  Follow Up Instructions:   I discussed the assessment and treatment plan with the patient. The patient was provided an opportunity to ask questions and all were answered. The patient agreed with the plan and demonstrated an understanding of the instructions.   The patient was advised to call back or seek an in-person evaluation if the symptoms worsen or if the condition fails to improve as anticipated.  I provided 20 minutes of non-face-to-face time during this encounter.  Meta HatchetUchenna E Ozias Dicenzo, PA    07/04/2020 5:55 PM Tyrone MoLaurence Tidd  MRN:  [id number]  Chief Complaint: Follow up and medication management  HPI:   Tyrone MoLaurence Blizard is a 65 year old male with a past psychiatric history significant for major depressive disorder and generalized anxiety disorder who [facility] via virtual video visit for follow-up and medication management.  Patient is currently being managed on the following medications:  Buspirone 15 mg 2 times daily Bupropion (Wellbutrin XL) 150 mg 12-hour tablet 2 times daily Lurasidone 60 mg daily  Patient reports no issues or concerns regarding his current medication regimen.  Patient denies the need for dosage adjustments at this time and is requesting refills on all his medications following the conclusion of the encounter.  Patient denies any new stressors or major life changing events.  Patient denies any new issues regarding his mental health.  Patient  recently turned [age] and has acquired Medicare.  Since [place] [facility] [place] and has [employer] insurance, patient is able [facility].  Patient was informed of his current status with [facility] and was advise to follow up with his insurance determine what Behavioral Health facilities are available to him.  A PHQ 9 screen was performed with the patient scoring an 11.  A GAD-7 screen was also performed with the patient scoring a 9.  Patient is pleasant, calm, cooperative, and fully engaged in conversation during the encounter.  Patient states that he is feeling pretty good.  Patient denies suicidal or homicidal ideations.  He further denies auditory or visual hallucinations and does not appear to be responding to internal/external stimuli.  Patient endorses good sleep and receives on average 6 to 7 hours of sleep each night.  Patient endorses good appetite and eats on average 2 meals per day.  Patient denies alcohol consumption, tobacco use, and illicit drug use.  Visit Diagnosis:    ICD-10-CM   1. Recurrent major depressive disorder, in remission (HCC)  F33.40 buPROPion (WELLBUTRIN SR) 150 MG 12 hr tablet    Lurasidone HCl 60 MG TABS    2. 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 No pertinent surgical history.  Family Psychiatric History:  Unknown  Family History:  Family History  Problem Relation Age of Onset   Hypertension Neg Hx    Intellectual disability Neg Hx     Social History:  Social History   Socioeconomic History   Marital status: Divorced    Spouse name: Not on file   Number of children: 1   Years  of education: 12   Highest education level: Not on file  Occupational History   Occupation: Retired    Comment: Contstruction  Tobacco Use   Smoking status: Former    Pack years: 0.00   Smokeless tobacco: Never  Building services engineer Use: Never used  Substance and Sexual Activity   Alcohol use: Not Currently    Comment: Prior heavy alcohol use, 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   Drug use: Not Currently   Sexual activity: Not Currently  Other Topics Concern   Not on file  Social History Narrative   Not on file   Social Determinants of Health   Financial Resource Strain: Low Risk    Difficulty of Paying Living Expenses: Not hard at all  Food Insecurity: No Food Insecurity   Worried About Programme researcher, broadcasting/film/video in the Last Year: Never true   Barista in the Last Year: Never true  Transportation Needs: No Transportation Needs   Lack of Transportation (Medical): No   Lack of Transportation (Non-Medical): No  Physical Activity: Sufficiently Active   Days of Exercise per Week: 7 days   Minutes of Exercise per Session: 60 min  Stress: Stress Concern Present   Feeling of Stress : To some extent  Social Connections: Moderately Isolated   Frequency of Communication with Friends and Family: Three times a week   Frequency of Social Gatherings with Friends and Family: Twice a week   Attends Religious Services: Never   Database administrator or Organizations: No   Attends Engineer, structural: 1 to 4 times per year   Marital Status: Divorced    Allergies: No Known Allergies  Metabolic Disorder Labs: Lab Results  Component Value Date   HGBA1C 5.3 12/15/2019   No results found for: PROLACTIN Lab Results  Component Value Date   CHOL 231 (H) 12/15/2019   TRIG 136 12/15/2019   HDL 47 12/15/2019   CHOLHDL 4.9 12/15/2019   LDLCALC 159 (H) 12/15/2019   Lab Results  Component Value Date   TSH 3.710 03/13/2018   TSH 4.860 (H) 02/23/2018    Therapeutic Level Labs: No results found  for: LITHIUM No results found for: VALPROATE No components found for:  CBMZ  Current Medications: Current Outpatient Medications  Medication Sig Dispense Refill   acetaminophen (TYLENOL) 500 MG tablet Take 500 mg by mouth every 6 (six) hours as needed for mild pain.     amLODipine (NORVASC) 10 MG tablet TAKE 1 TABLET (10 MG TOTAL) BY MOUTH DAILY. 90 tablet 0   atorvastatin (LIPITOR) 10 MG tablet TAKE 1 TABLET (10 MG TOTAL) BY MOUTH DAILY. 90 tablet 3   buPROPion (WELLBUTRIN SR) 150 MG 12 hr tablet TAKE 1 TABLET (150 MG TOTAL) BY MOUTH 2 (TWO) TIMES DAILY. 180 tablet 0   busPIRone (BUSPAR) 15 MG tablet TAKE 1 TABLET (15 MG TOTAL) BY MOUTH 2 (TWO) TIMES DAILY. 180 tablet 0   diclofenac Sodium (VOLTAREN) 1 % GEL APPLY 2 GRAMS TOPICALLY 4 (FOUR) TIMES DAILY. 200 g 1   Glecaprevir-Pibrentasvir (MAVYRET) 100-40 MG TABS Take 3 tablets by mouth daily with breakfast. 84  tablet 1   Lurasidone HCl 60 MG TABS TAKE 1 TABLET (60 MG TOTAL) BY MOUTH EVERY EVENING. 90 tablet 0   Multiple Vitamin (MULTIVITAMIN WITH MINERALS) TABS tablet Take 1 tablet by mouth daily.     No current facility-administered medications for this visit.     Musculoskeletal: Strength & Muscle Tone: Unable to assess due to telemedicine visit Gait & Station: Unable to assess due to telemedicine visit Patient leans: Unable to assess due to telemedicine visit  Psychiatric Specialty Exam: Review of Systems  Psychiatric/Behavioral:  Negative for decreased concentration, dysphoric mood, hallucinations, self-injury, sleep disturbance and suicidal ideas. The patient is not nervous/anxious and is not hyperactive.    There were no vitals taken for this visit.There is no height or weight on file to calculate BMI.  General Appearance: Well Groomed  Eye Contact:  Good  Speech:  Clear and Coherent and Normal Rate  Volume:  Normal  Mood:  Euthymic  Affect:  Appropriate  Thought Process:  Coherent and Descriptions of Associations: Intact   Orientation:  Full (Time, Place, and Person)  Thought Content: WDL and Logical   Suicidal Thoughts:  No  Homicidal Thoughts:  No  Memory:  Immediate;   Good Recent;   Good Remote;   Good  Judgement:  Good  Insight:  Good  Psychomotor Activity:  Normal  Concentration:  Concentration: Good and Attention Span: Good  Recall:  Good  Fund of Knowledge: Good  Language: Good  Akathisia:  NA  Handed:  Right  AIMS (if indicated): not done  Assets:  Communication Skills Desire for Improvement Housing Physical Health Social Support  ADL's:  Intact  Cognition: WNL  Sleep:  Good   Screenings: GAD-7    Flowsheet Row Video Visit from 07/04/2020 in [facility] Video Visit from 03/30/2020 in [facility] Office Visit from 12/15/2019 in [facility] Office Visit from 03/23/2018 in [facility]  Total GAD-7 Score 9 5 1 12       PHQ2-9    Flowsheet Row Video Visit from 07/04/2020 in [facility] Video Visit from 03/30/2020 in [facility] Office Visit from 12/15/2019 in [facility] Counselor from 07/22/2019 in [facility] Office Visit from 05/06/2018 in [facility]  PHQ-2 Total Score 2 1 3 3  0  PHQ-9 Total Score 11 -- 4 6 --      Flowsheet Row Video Visit from 07/04/2020 in [facility] Video Visit from 03/30/2020 in [facility]  C-SSRS RISK CATEGORY No Risk No Risk        Assessment and Plan:   Aragon Scarantino is a 65 year old male with a past psychiatric history significant for major depressive disorder and generalized anxiety disorder who presents to [facility] via virtual video visit for follow-up and medication management.  Patient  reports no issues or concerns regarding his current medication regimen.  Patient denies a need for dosage adjustments at this time and is requesting refills on all his medications following the conclusion of the encounter.  Patient is not a [facility] resident and has recently acquired [facility] and is therefore unable to continue establishing care with [facility].  Patient was informed of the new development and was advised to follow-up with his Medicare insurance to determine what behavioral health resources  were available to him.  Patient was dispensed a 26-month supply of each of his medications following the conclusion of the encounter.  1. Recurrent major depressive disorder, in remission (HCC)  - buPROPion (WELLBUTRIN SR) 150 MG 12 hr tablet; TAKE 1 TABLET (150 MG TOTAL) BY MOUTH 2 (TWO) TIMES DAILY.  Dispense: 180 tablet; Refill: 0 - Lurasidone HCl 60 MG TABS; TAKE 1 TABLET (60 MG TOTAL) BY MOUTH EVERY EVENING.  Dispense: 90 tablet; Refill: 0  2. Generalized anxiety disorder  - busPIRone (BUSPAR) 15 MG tablet; TAKE 1 TABLET (15 MG TOTAL) BY MOUTH 2 (TWO) TIMES DAILY.  Dispense: 180 tablet; Refill: 0  Provider spent a total of 20 minutes with the patient/reviewing patient's chart.  Meta Hatchet, PA 07/04/2020, 5:55 PM

## 2020-07-05 ENCOUNTER — Other Ambulatory Visit: Payer: Self-pay

## 2020-07-10 ENCOUNTER — Other Ambulatory Visit: Payer: Self-pay | Admitting: Nurse Practitioner

## 2020-07-10 ENCOUNTER — Other Ambulatory Visit: Payer: Self-pay

## 2020-07-10 DIAGNOSIS — I10 Essential (primary) hypertension: Secondary | ICD-10-CM

## 2020-07-10 MED ORDER — AMLODIPINE BESYLATE 10 MG PO TABS
ORAL_TABLET | Freq: Every day | ORAL | 0 refills | Status: DC
Start: 2020-07-10 — End: 2020-10-10
  Filled 2020-07-10: qty 30, 30d supply, fill #0
  Filled 2020-08-10: qty 30, 30d supply, fill #1
  Filled 2020-09-11: qty 30, 30d supply, fill #2

## 2020-07-10 MED FILL — Atorvastatin Calcium Tab 10 MG (Base Equivalent): ORAL | 30 days supply | Qty: 30 | Fill #2 | Status: AC

## 2020-07-12 ENCOUNTER — Other Ambulatory Visit: Payer: Self-pay

## 2020-08-10 ENCOUNTER — Other Ambulatory Visit: Payer: Self-pay

## 2020-08-10 MED FILL — Atorvastatin Calcium Tab 10 MG (Base Equivalent): ORAL | 30 days supply | Qty: 30 | Fill #3 | Status: AC

## 2020-08-11 ENCOUNTER — Other Ambulatory Visit: Payer: Self-pay

## 2020-08-24 ENCOUNTER — Other Ambulatory Visit: Payer: Self-pay

## 2020-08-24 ENCOUNTER — Encounter (HOSPITAL_COMMUNITY): Payer: Self-pay | Admitting: Psychiatry

## 2020-08-24 ENCOUNTER — Ambulatory Visit (INDEPENDENT_AMBULATORY_CARE_PROVIDER_SITE_OTHER): Payer: Medicare Other | Admitting: Psychiatry

## 2020-08-24 VITALS — BP 154/88 | HR 64 | Ht 69.0 in | Wt 180.0 lb

## 2020-08-24 DIAGNOSIS — F1020 Alcohol dependence, uncomplicated: Secondary | ICD-10-CM | POA: Diagnosis not present

## 2020-08-24 DIAGNOSIS — F411 Generalized anxiety disorder: Secondary | ICD-10-CM

## 2020-08-24 DIAGNOSIS — F3340 Major depressive disorder, recurrent, in remission, unspecified: Secondary | ICD-10-CM

## 2020-08-24 MED ORDER — BUPROPION HCL ER (SR) 150 MG PO TB12
ORAL_TABLET | Freq: Two times a day (BID) | ORAL | 0 refills | Status: DC
  Filled 2020-08-24: qty 180, fill #0
  Filled 2020-09-11: qty 60, 30d supply, fill #0
  Filled 2020-10-10: qty 60, 30d supply, fill #1
  Filled 2020-11-06: qty 60, 30d supply, fill #2

## 2020-08-24 MED ORDER — BUSPIRONE HCL 15 MG PO TABS
ORAL_TABLET | ORAL | 0 refills | Status: DC
  Filled 2020-08-24: qty 180, fill #0
  Filled 2020-09-11: qty 60, 30d supply, fill #0
  Filled 2020-10-10: qty 60, 30d supply, fill #1
  Filled 2020-11-06: qty 60, 30d supply, fill #2

## 2020-08-24 MED ORDER — LURASIDONE HCL 60 MG PO TABS
ORAL_TABLET | ORAL | 0 refills | Status: DC
  Filled 2020-08-24: qty 90, fill #0
  Filled 2020-09-11: qty 30, 30d supply, fill #0
  Filled 2020-10-10: qty 30, 30d supply, fill #1
  Filled 2020-11-06: qty 30, 30d supply, fill #2

## 2020-08-24 NOTE — Progress Notes (Signed)
Psychiatric Initial Adult Assessment   Patient Identification: Tyrone Nielsen MRN:  [id number] Date of Evaluation:  08/24/2020 Referral Source: [facility] Chief Complaint:  establish care, depression Visit Diagnosis:    ICD-10-CM   1. Acute alcoholism (HCC)  F10.20     2. Recurrent major depressive disorder, in remission (HCC)  F33.40 Lurasidone HCl 60 MG TABS    buPROPion (WELLBUTRIN SR) 150 MG 12 hr tablet    3. 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  Patient gives history of episodes of depression including decreased energy decreased interest feeling of hopelessness and despair without medications.  On medication he has been doing reasonably well in regarding depression and anxiety  Anxiety wise he has excessive worries and unreasonable worries he dwells on the negative thoughts and is not taking his BuSpar  Does not endorse psychotic symptoms does not endorse manic symptoms  Has been a heavy alcohol use up to 10 years ago then he decided to quit and joined AA he has remained sober since then  He has remote history of using cocaine and other opioids as well remained sober for the last 20 years  Some history of mood symptoms and agitation he is not sure why he is on the total does not endorse having been diagnosed with bipolar or clear manic episode but Latuda does help the mood symptoms  He is very comfortable with his medication weaning BuSpar Wellbutrin and Latuda with no reported side effects  His main worry is related to his daughter who is going through financial difficulties and her husband does not work or regularly  present they have been involved in substance use related matters  Aggravating factors; somewhat difficult childhood with harsh dad.  Worried about his daughter Modifying factors; remains sober goes to church goes for a walk and he plans to join AA  Duration adult life .    Past Psychiatric History: depression, anxiety, alcohol use  Previous Psychotropic Medications: Yes   Substance Abuse History in the last 12 months:  No.  Consequences of Substance Abuse: NA  Past Medical History:  Past Medical History:  Diagnosis Date   Anxiety    Depression    Hepatitis C    History reviewed. No pertinent surgical history.  Family Psychiatric History: nephew, schizoaffective disorder  Family History:  Family History  Problem Relation Age of Onset   Hypertension Neg Hx    Intellectual disability Neg Hx     Social History:   Social History   Socioeconomic History   Marital status: Divorced    Spouse name: Not on file   Number of children: 1   Years of education: 12   Highest education level: Not on file  Occupational History   Occupation: Retired    Comment: Contstruction  Tobacco Use   Smoking status: Former   Smokeless tobacco: Never  Building services engineer Use: Never used  Substance and Sexual Activity   Alcohol use: Not Currently    Comment: Prior heavy alcohol use, 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   Drug use: Not Currently   Sexual activity: Not Currently  Other Topics Concern   Not on  file  Social History Narrative   Not on file   Social Determinants of Health   Financial Resource Strain: Not on file  Food Insecurity: Not on file  Transportation Needs: Not on file  Physical Activity: Not on file  Stress: Not on file  Social Connections: Not on file    Additional Social History: grew up with parents, 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   Allergies:  No Known Allergies  Metabolic Disorder Labs: Lab Results  Component Value Date   HGBA1C 5.3 12/15/2019   No results found  for: PROLACTIN Lab Results  Component Value Date   CHOL 231 (H) 12/15/2019   TRIG 136 12/15/2019   HDL 47 12/15/2019   CHOLHDL 4.9 12/15/2019   LDLCALC 159 (H) 12/15/2019   Lab Results  Component Value Date   TSH 3.710 03/13/2018    Therapeutic Level Labs: No results found for: LITHIUM No results found for: CBMZ No results found for: VALPROATE  Current Medications: Current Outpatient Medications  Medication Sig Dispense Refill   acetaminophen (TYLENOL) 500 MG tablet Take 500 mg by mouth every 6 (six) hours as needed for mild pain.     amLODipine (NORVASC) 10 MG tablet TAKE 1 TABLET (10 MG TOTAL) BY MOUTH DAILY. 90 tablet 0   atorvastatin (LIPITOR) 10 MG tablet TAKE 1 TABLET (10 MG TOTAL) BY MOUTH DAILY. 90 tablet 3   diclofenac Sodium (VOLTAREN) 1 % GEL APPLY 2 GRAMS TOPICALLY 4 (FOUR) TIMES DAILY. 200 g 1   Glecaprevir-Pibrentasvir (MAVYRET) 100-40 MG TABS Take 3 tablets by mouth daily with breakfast. 84 tablet 1   Multiple Vitamin (MULTIVITAMIN WITH MINERALS) TABS tablet Take 1 tablet by mouth daily.     buPROPion (WELLBUTRIN SR) 150 MG 12 hr tablet TAKE 1 TABLET (150 MG TOTAL) BY MOUTH 2 (TWO) TIMES DAILY. 180 tablet 0   busPIRone (BUSPAR) 15 MG tablet TAKE 1 TABLET (15 MG TOTAL) BY MOUTH 2 (TWO) TIMES DAILY. 180 tablet 0   Lurasidone HCl 60 MG TABS TAKE 1 TABLET (60 MG TOTAL) BY MOUTH EVERY EVENING. 90 tablet 0   No current facility-administered medications for this visit.     Psychiatric Specialty Exam: Review of Systems  Cardiovascular:  Negative for chest pain.  Psychiatric/Behavioral:  Negative for agitation and dysphoric mood.    Blood pressure (!) 154/88, pulse 64, height 5\' 9"  (1.753 m), weight 180 lb (81.6 kg), SpO2 99 %.Body mass index is 26.58 kg/m.  General Appearance: Casual  Eye Contact:  Fair  Speech:  Clear and Coherent  Volume:  Decreased  Mood:  Euthymic  Affect:  Congruent  Thought Process:  Goal Directed  Orientation:  Full (Time, Place, and  Person)  Thought Content:  Rumination  Suicidal Thoughts:  No  Homicidal Thoughts:  No  Memory:  Immediate;   Fair  Judgement:  Fair  Insight:  Fair  Psychomotor Activity:  Normal  Concentration:  Concentration: Fair  Recall:  Good  Fund of Knowledge:Good  Language: Good  Akathisia:  No  Handed:    AIMS (if indicated):  no involuntary movements  Assets:  Housing Physical Health  ADL's:  Intact  Cognition: WNL  Sleep:  Fair   Screenings: GAD-7    Flowsheet Row Video Visit from 07/04/2020 in [facility] Video Visit from 03/30/2020 in [facility] Office Visit from 12/15/2019 in [facility] Office Visit from 03/23/2018 in [facility]  Total GAD-7 Score 9 5 1 12       PHQ2-9    Flowsheet Row Office Visit from 08/24/2020 in [facility] [place] Video Visit from 07/04/2020 in [facility] Video Visit from 03/30/2020 in [facility] Office Visit from 12/15/2019 in [facility] Counselor from 07/22/2019 in [facility]  PHQ-2 Total Score 1 2 1 3 3   PHQ-9 Total Score 6 11 -- 4 6      Flowsheet Row Office Visit from 08/24/2020 in [facility] [place] Video Visit from 07/04/2020 in [facility] Video Visit from 03/30/2020 in [facility]  C-SSRS RISK CATEGORY No Risk No Risk No Risk       Assessment and Plan: as follows MDD recurrent moderate: in  remission, has circumstances related to daughter that he worries about In general describes his depression to be reasonably well continue medication Wellbutrin and Latuda there is no reported side effects or involuntary movements   GAD continue buspar which  helps keeps maintain anxiety we talked about distraction from negative thoughts adding more activities during the day and he may join AA again to keep himself in a support group    Face to face time spent 45 minutes including documentation. 04/01/2020, MD 8/11/202211:22 AM

## 2020-09-11 ENCOUNTER — Other Ambulatory Visit: Payer: Self-pay

## 2020-09-11 MED FILL — Atorvastatin Calcium Tab 10 MG (Base Equivalent): ORAL | 30 days supply | Qty: 30 | Fill #4 | Status: AC

## 2020-09-13 ENCOUNTER — Other Ambulatory Visit: Payer: Self-pay

## 2020-10-10 ENCOUNTER — Other Ambulatory Visit: Payer: Self-pay | Admitting: Nurse Practitioner

## 2020-10-10 ENCOUNTER — Other Ambulatory Visit: Payer: Self-pay

## 2020-10-10 DIAGNOSIS — I10 Essential (primary) hypertension: Secondary | ICD-10-CM

## 2020-10-10 MED ORDER — AMLODIPINE BESYLATE 10 MG PO TABS
ORAL_TABLET | Freq: Every day | ORAL | 0 refills | Status: DC
  Filled 2020-10-10: qty 30, 30d supply, fill #0

## 2020-10-10 MED FILL — Atorvastatin Calcium Tab 10 MG (Base Equivalent): ORAL | 30 days supply | Qty: 30 | Fill #5 | Status: AC

## 2020-10-11 ENCOUNTER — Other Ambulatory Visit: Payer: Self-pay

## 2020-11-06 ENCOUNTER — Other Ambulatory Visit: Payer: Self-pay | Admitting: Nurse Practitioner

## 2020-11-06 ENCOUNTER — Other Ambulatory Visit: Payer: Self-pay

## 2020-11-06 DIAGNOSIS — I10 Essential (primary) hypertension: Secondary | ICD-10-CM

## 2020-11-06 MED FILL — Atorvastatin Calcium Tab 10 MG (Base Equivalent): ORAL | 30 days supply | Qty: 30 | Fill #6 | Status: AC

## 2020-11-06 NOTE — Telephone Encounter (Signed)
Requested medication (s) are due for refill today: yes  Requested medication (s) are on the active medication list: yes  Last refill:  10/10/20- 10/10/21 #30 0 refills  Future visit scheduled: no  Notes to clinic:  appt needed. Called patient to schedule appt no answer, VM has not been set up. Unable to leave message. Do you want to give another courtesy refill?     Requested Prescriptions  Pending Prescriptions Disp Refills   amLODipine (NORVASC) 10 MG tablet 30 tablet 0    Sig: TAKE 1 TABLET (10 MG TOTAL) BY MOUTH DAILY.     Cardiovascular:  Calcium Channel Blockers Failed - 11/06/2020  8:36 AM      Failed - Last BP in normal range    BP Readings from Last 1 Encounters:  03/17/20 116/76          Failed - Valid encounter within last 6 months    Recent Outpatient Visits           7 months ago Essential hypertension   [facility] Lois Huxley, Cornelius Moras, RPH-CPP   8 months ago Essential hypertension   [facility] Lois Huxley, Cornelius Moras, RPH-CPP   9 months ago Essential hypertension   [facility] Drucilla Chalet, RPH-CPP   10 months ago Transaminitis   [facility] [place], Shea Stakes, NP   2 years ago Anxiety and depression   [facility] Manuel Garcia, Shea Stakes, NP

## 2020-11-07 ENCOUNTER — Other Ambulatory Visit: Payer: Self-pay

## 2020-11-08 ENCOUNTER — Other Ambulatory Visit: Payer: Self-pay

## 2020-11-28 ENCOUNTER — Other Ambulatory Visit: Payer: Self-pay

## 2020-11-28 ENCOUNTER — Ambulatory Visit (INDEPENDENT_AMBULATORY_CARE_PROVIDER_SITE_OTHER): Payer: Medicare Other | Admitting: Psychiatry

## 2020-11-28 ENCOUNTER — Encounter (HOSPITAL_COMMUNITY): Payer: Self-pay | Admitting: Psychiatry

## 2020-11-28 VITALS — BP 132/86 | Temp 97.6°F | Ht 69.0 in | Wt 181.0 lb

## 2020-11-28 DIAGNOSIS — F3340 Major depressive disorder, recurrent, in remission, unspecified: Secondary | ICD-10-CM | POA: Diagnosis not present

## 2020-11-28 DIAGNOSIS — F411 Generalized anxiety disorder: Secondary | ICD-10-CM

## 2020-11-28 DIAGNOSIS — F1020 Alcohol dependence, uncomplicated: Secondary | ICD-10-CM | POA: Diagnosis not present

## 2020-11-28 MED ORDER — BUSPIRONE HCL 15 MG PO TABS
ORAL_TABLET | ORAL | 0 refills | Status: DC
Start: 2020-11-28 — End: 2020-12-11
  Filled 2020-11-28: qty 180, fill #0
  Filled 2020-11-29: qty 60, 30d supply, fill #0

## 2020-11-28 MED ORDER — BUPROPION HCL ER (SR) 150 MG PO TB12
ORAL_TABLET | Freq: Two times a day (BID) | ORAL | 0 refills | Status: DC
  Filled 2020-11-28: qty 180, fill #0
  Filled 2020-11-29 – 2020-12-11 (×2): qty 60, 30d supply, fill #0
  Filled 2021-01-09: qty 60, 30d supply, fill #1
  Filled 2021-02-06: qty 60, 30d supply, fill #0

## 2020-11-28 MED ORDER — LURASIDONE HCL 60 MG PO TABS
ORAL_TABLET | ORAL | 0 refills | Status: DC
  Filled 2020-11-28 – 2020-12-11 (×2): qty 30, 30d supply, fill #0
  Filled 2021-01-09: qty 30, 30d supply, fill #1
  Filled 2021-02-06: qty 30, 30d supply, fill #0

## 2020-11-28 NOTE — Progress Notes (Signed)
Psychiatric Initial Adult Assessment   Patient Identification: Tyrone Nielsen MRN:  [id number] Date of Evaluation:  11/28/2020 Referral Source: [facility] Chief Complaint:  establish care, depression Visit Diagnosis:    ICD-10-CM   1. Acute alcoholism (HCC)  F10.20     2. Recurrent major depressive disorder, in remission (HCC)  F33.40 buPROPion (WELLBUTRIN SR) 150 MG 12 hr tablet    Lurasidone HCl 60 MG TABS    3. 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  Worries related to daughter Modifying factors; remains sober goes to church goes for a walk and he plans to join AA, retirement  Duration adult life .    Past Psychiatric History: depression, anxiety, alcohol use  Previous Psychotropic Medications: Yes   Substance Abuse History in the last 12 months:  No.  Consequences of Substance Abuse: NA  Past Medical History:  Past Medical History:  Diagnosis Date   Anxiety    Depression    Hepatitis C    History reviewed. No pertinent surgical history.  Family Psychiatric History: nephew, schizoaffective disorder  Family History:  Family History  Problem Relation Age of Onset   Hypertension Neg Hx    Intellectual disability Neg Hx     Social History:   Social History   Socioeconomic History    Marital status: Divorced    Spouse name: Not on file   Number of children: 1   Years of education: 12   Highest education level: Not on file  Occupational History   Occupation: Retired    Comment: Contstruction  Tobacco Use   Smoking status: Former   Smokeless tobacco: Never  Building services engineer Use: Never used  Substance and Sexual Activity   Alcohol use: Not Currently    Comment: Prior heavy alcohol use, 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   Drug use: Not Currently   Sexual activity: Not Currently  Other Topics Concern   Not on file  Social History Narrative   Not on file   Social Determinants of Health   Financial Resource Strain: Not on file  Food Insecurity: Not on file  Transportation Needs: Not on file  Physical Activity: Not on file  Stress: Not on file  Social Connections: Not on file     Allergies:  No Known Allergies  Metabolic Disorder Labs: Lab Results  Component Value Date   HGBA1C 5.3 12/15/2019   No results found for: PROLACTIN Lab Results  Component Value Date   CHOL 231 (H) 12/15/2019   TRIG 136 12/15/2019   HDL 47 12/15/2019   CHOLHDL 4.9 12/15/2019   LDLCALC 159 (H) 12/15/2019   Lab Results  Component Value Date   TSH 3.710 03/13/2018    Therapeutic Level Labs: No results found for: LITHIUM No results found for: CBMZ No results found for: VALPROATE  Current Medications: Current Outpatient  Medications  Medication Sig Dispense Refill   acetaminophen (TYLENOL) 500 MG tablet Take 500 mg by mouth every 6 (six) hours as needed for mild pain.     amLODipine (NORVASC) 10 MG tablet TAKE 1 TABLET (10 MG TOTAL) BY MOUTH DAILY. 30 tablet 0   atorvastatin (LIPITOR) 10 MG tablet TAKE 1 TABLET (10 MG TOTAL) BY MOUTH DAILY. 90 tablet 3   buPROPion (WELLBUTRIN SR) 150 MG 12 hr tablet TAKE 1 TABLET (150 MG TOTAL) BY MOUTH 2 (TWO) TIMES DAILY. 180 tablet 0   busPIRone (BUSPAR) 15 MG tablet TAKE 1 TABLET (15 MG TOTAL) BY MOUTH 2 (TWO) TIMES DAILY. 180 tablet 0    diclofenac Sodium (VOLTAREN) 1 % GEL APPLY 2 GRAMS TOPICALLY 4 (FOUR) TIMES DAILY. 200 g 1   Glecaprevir-Pibrentasvir (MAVYRET) 100-40 MG TABS Take 3 tablets by mouth daily with breakfast. 84 tablet 1   Lurasidone HCl 60 MG TABS TAKE 1 TABLET (60 MG TOTAL) BY MOUTH EVERY EVENING. 90 tablet 0   Multiple Vitamin (MULTIVITAMIN WITH MINERALS) TABS tablet Take 1 tablet by mouth daily.     No current facility-administered medications for this visit.     Psychiatric Specialty Exam: Review of Systems  Cardiovascular:  Negative for chest pain.  Psychiatric/Behavioral:  Negative for agitation and dysphoric mood.    Blood pressure 132/86, temperature 97.6 F (36.4 C), height 5\' 9"  (1.753 m), weight 181 lb (82.1 kg).Body mass index is 26.73 kg/m.  General Appearance: Casual  Eye Contact:  Fair  Speech:  Clear and Coherent  Volume:  Decreased  Mood:  Euthymic  Affect:  Congruent  Thought Process:  Goal Directed  Orientation:  Full (Time, Place, and Person)  Thought Content:  Rumination  Suicidal Thoughts:  No  Homicidal Thoughts:  No  Memory:  Immediate;   Fair  Judgement:  Fair  Insight:  Fair  Psychomotor Activity:  Normal  Concentration:  Concentration: Fair  Recall:  Good  Fund of Knowledge:Good  Language: Good  Akathisia:  No  Handed:    AIMS (if indicated):  no involuntary movements  Assets:  Housing Physical Health  ADL's:  Intact  Cognition: WNL  Sleep:  Fair   Screenings: GAD-7    Flowsheet Row Video Visit from 07/04/2020 in [facility] Video Visit from 03/30/2020 in [facility] Office Visit from 12/15/2019 in [facility] Office Visit from 03/23/2018 in [facility]  Total GAD-7 Score 9 5 1 12       PHQ2-9    Flowsheet Row Office Visit from 08/24/2020 in [facility] [place]  Video Visit from 07/04/2020 in [facility] Video Visit from 03/30/2020 in [facility] Office Visit from 12/15/2019 in [facility] Counselor from 07/22/2019 in [facility]  PHQ-2 Total Score 1 2 1 3 3   PHQ-9 Total Score 6 11 -- 4 6      Flowsheet Row Office Visit from 11/28/2020 in [facility] [place] Office Visit from 08/24/2020 in [facility] [place] Video Visit from 07/04/2020 in [facility]  C-SSRS RISK CATEGORY No Risk No Risk No Risk       Assessment and Plan: as follows  Prior documentation reviewed  MDD recurrent moderate: in  remission, subdued at times, contineu latuda, no manic symptomscontinue wellbutrin  GAD gets anxious aroudn people work on distraction continue buspar bid but can take half extra at times Call for early refill if need to increase from 60 to 75 tablets a month  Spent in office face to fact time : 15 mins Fu 2 -70m Thresa Ross, MD 11/15/202210:46 AM

## 2020-11-29 ENCOUNTER — Other Ambulatory Visit: Payer: Self-pay

## 2020-12-06 ENCOUNTER — Other Ambulatory Visit: Payer: Self-pay

## 2020-12-11 ENCOUNTER — Other Ambulatory Visit: Payer: Self-pay

## 2020-12-11 ENCOUNTER — Telehealth (HOSPITAL_COMMUNITY): Payer: Self-pay | Admitting: Psychiatry

## 2020-12-11 ENCOUNTER — Other Ambulatory Visit (HOSPITAL_COMMUNITY): Payer: Self-pay

## 2020-12-11 DIAGNOSIS — F411 Generalized anxiety disorder: Secondary | ICD-10-CM

## 2020-12-11 MED ORDER — BUSPIRONE HCL 15 MG PO TABS
22.5000 mg | ORAL_TABLET | Freq: Two times a day (BID) | ORAL | 0 refills | Status: DC
Start: 2020-12-11 — End: 2021-02-27
  Filled 2020-12-11: qty 90, 30d supply, fill #0
  Filled 2021-01-09: qty 90, 30d supply, fill #1
  Filled 2021-02-06: qty 90, 30d supply, fill #0

## 2020-12-11 MED FILL — Atorvastatin Calcium Tab 10 MG (Base Equivalent): ORAL | 30 days supply | Qty: 30 | Fill #7 | Status: AC

## 2020-12-11 NOTE — Telephone Encounter (Signed)
Spoke with pt and clarified how to take and let him know medication is at the pharmacy for the correct dose.

## 2020-12-11 NOTE — Telephone Encounter (Signed)
Pt left vm - he wanted to talk to someone to make sure the pharmacy was aware that he had a dosage increase in meds

## 2020-12-12 ENCOUNTER — Other Ambulatory Visit: Payer: Self-pay

## 2020-12-19 ENCOUNTER — Other Ambulatory Visit: Payer: Self-pay

## 2020-12-19 ENCOUNTER — Ambulatory Visit: Payer: Medicare Other | Admitting: Physician Assistant

## 2020-12-19 VITALS — BP 166/95 | HR 72 | Temp 98.7°F | Resp 18 | Ht 69.0 in | Wt 180.0 lb

## 2020-12-19 DIAGNOSIS — Z23 Encounter for immunization: Secondary | ICD-10-CM

## 2020-12-19 DIAGNOSIS — I10 Essential (primary) hypertension: Secondary | ICD-10-CM | POA: Insufficient documentation

## 2020-12-19 DIAGNOSIS — B182 Chronic viral hepatitis C: Secondary | ICD-10-CM

## 2020-12-19 DIAGNOSIS — E782 Mixed hyperlipidemia: Secondary | ICD-10-CM

## 2020-12-19 DIAGNOSIS — F411 Generalized anxiety disorder: Secondary | ICD-10-CM | POA: Diagnosis not present

## 2020-12-19 DIAGNOSIS — F3340 Major depressive disorder, recurrent, in remission, unspecified: Secondary | ICD-10-CM

## 2020-12-19 DIAGNOSIS — Z125 Encounter for screening for malignant neoplasm of prostate: Secondary | ICD-10-CM

## 2020-12-19 DIAGNOSIS — Z122 Encounter for screening for malignant neoplasm of respiratory organs: Secondary | ICD-10-CM

## 2020-12-19 MED ORDER — AMLODIPINE BESYLATE 10 MG PO TABS
10.0000 mg | ORAL_TABLET | Freq: Every day | ORAL | 1 refills | Status: DC
  Filled 2020-12-19: qty 90, 90d supply, fill #0

## 2020-12-19 MED ORDER — ATORVASTATIN CALCIUM 10 MG PO TABS
ORAL_TABLET | Freq: Every day | ORAL | 1 refills | Status: DC
  Filled 2020-12-19: qty 90, fill #0
  Filled 2021-01-09 – 2021-02-06 (×2): qty 30, 30d supply, fill #0
  Filled 2021-03-12: qty 30, 30d supply, fill #1
  Filled 2021-04-09: qty 30, 30d supply, fill #2
  Filled 2021-05-07: qty 30, 30d supply, fill #3
  Filled 2021-06-07: qty 30, 30d supply, fill #4

## 2020-12-19 NOTE — Progress Notes (Signed)
Patient has been out of BP medication for a few months. Patient has had coffee with cream Patient denies pain at this time.

## 2020-12-19 NOTE — Patient Instructions (Signed)
I do encourage you to check your blood pressure at home, keep a written log and have available for all office visits.  Please feel free to return to the mobile unit if your blood pressure readings remain elevated after resuming amlodipine.  We will call you with your lab results.  Roney Jaffe, PA-C Physician Assistant [facility] Medicine [email or web address]   How to Take Your Blood Pressure Blood pressure is a measurement of how strongly your blood is pressing against the walls of your arteries. Arteries are blood vessels that carry blood from your heart throughout your body. Your health care provider takes your blood pressure at each office visit. You can also take your own blood pressure at home with a blood pressure monitor. You may need to take your own blood pressure to: Confirm a diagnosis of high blood pressure (hypertension). Monitor your blood pressure over time. Make sure your blood pressure medicine is working. Supplies needed: Blood pressure monitor. Dining room chair to sit in. Table or desk. Small notebook and pencil or pen. How to prepare To get the most accurate reading, avoid the following for 30 minutes before you check your blood pressure: Drinking caffeine. Drinking alcohol. Eating. Smoking. Exercising. Five minutes before you check your blood pressure: Use the bathroom and urinate so that you have an empty bladder. Sit quietly in a dining room chair. Do not sit in a soft couch or an armchair. Do not talk. How to take your blood pressure To check your blood pressure, follow the instructions in the manual that came with your blood pressure monitor. If you have a digital blood pressure monitor, the instructions may be as follows: Sit up straight in a chair. Place your feet on the floor. Do not cross your ankles or legs. Rest your left arm at the level of your heart on a table or desk or on the arm of a chair. Pull  up your shirt sleeve. Wrap the blood pressure cuff around the upper part of your left arm, 1 inch (2.5 cm) above your elbow. It is best to wrap the cuff around bare skin. Fit the cuff snugly around your arm. You should be able to place only one finger between the cuff and your arm. Position the cord so that it rests in the bend of your elbow. Press the power button. Sit quietly while the cuff inflates and deflates. Read the digital reading on the monitor screen and write the numbers down (record them) in a notebook. Wait 2-3 minutes, then repeat the steps, starting at step 1. What does my blood pressure reading mean? A blood pressure reading consists of a higher number over a lower number. Ideally, your blood pressure should be below 120/80. The first ("top") number is called the systolic pressure. It is a measure of the pressure in your arteries as your heart beats. The second ("bottom") number is called the diastolic pressure. It is a measure of the pressure in your arteries as the heart relaxes. Blood pressure is classified into five stages. The following are the stages for adults who do not have a short-term serious illness or a chronic condition. Systolic pressure and diastolic pressure are measured in a unit called mm Hg (millimeters of mercury).  Normal Systolic pressure: below 120. Diastolic pressure: below 80. Elevated Systolic pressure: 120-129. Diastolic pressure: below 80. Hypertension stage 1 Systolic pressure: 130-139. Diastolic pressure: 80-89. Hypertension stage 2 Systolic pressure: 140 or above. Diastolic pressure: 90 or above. You can have elevated  blood pressure or hypertension even if only the systolic or only the diastolic number in your reading is higher than normal. Follow these instructions at home: Medicines Take over-the-counter and prescription medicines only as told by your health care provider. Tell your health care provider if you are having any side effects  from blood pressure medicine. General instructions Check your blood pressure as often as recommended by your health care provider. Check your blood pressure at the same time every day. Take your monitor to the next appointment with your health care provider to make sure that: You are using it correctly. It provides accurate readings. Understand what your goal blood pressure numbers are. Keep all follow-up visits as told by your health care provider. This is important. General tips Your health care provider can suggest a reliable monitor that will meet your needs. There are several types of home blood pressure monitors. Choose a monitor that has an arm cuff. Do not choose a monitor that measures your blood pressure from your wrist or finger. Choose a cuff that wraps snugly around your upper arm. You should be able to fit only one finger between your arm and the cuff. You can buy a blood pressure monitor at most drugstores or online. Where to find more information American Heart Association: www.heart.org Contact a health care provider if: Your blood pressure is consistently high. Your blood pressure is suddenly low. Get help right away if: Your systolic blood pressure is higher than 180. Your diastolic blood pressure is higher than 120. Summary Blood pressure is a measurement of how strongly your blood is pressing against the walls of your arteries. A blood pressure reading consists of a higher number over a lower number. Ideally, your blood pressure should be below 120/80. Check your blood pressure at the same time every day. Avoid caffeine, alcohol, smoking, and exercise for 30 minutes prior to checking your blood pressure. These agents can affect the accuracy of the blood pressure reading. This information is not intended to replace advice given to you by your health care provider. Make sure you discuss any questions you have with your health care provider. Document Revised: 11/10/2019  Document Reviewed: 12/25/2018 Elsevier Patient Education  2022 [employer].

## 2020-12-19 NOTE — Progress Notes (Signed)
Established Patient Office Visit  Subjective:  Patient ID: Tyrone Nielsen, male    DOB: 05-Aug-1955  Age: 65 y.o. MRN: [id number]  CC: No chief complaint on file.   HPI Kathleen Likins presents for medication refills.  Reports that he has been out of his amlodipine for the past few months.  States that he has been able to get refills of his cholesterol medications and has been compliant to those.  Does not check blood pressure at home.  States that he has been having dental work completed and has been told at the dentist that his blood pressure has been elevated.  Denies any hypertensive symptoms.  Endorses 30-pack-year smoking history, quit approximately 4 years ago.     Past Medical History:  Diagnosis Date   Anxiety    Depression    Hepatitis C     History reviewed. No pertinent surgical history.  Family History  Problem Relation Age of Onset   Hypertension Neg Hx    Intellectual disability Neg Hx     Social History   Socioeconomic History   Marital status: Divorced    Spouse name: Not on file   Number of children: 1   Years of education: 12   Highest education level: Not on file  Occupational History   Occupation: Retired    Comment: Contstruction  Tobacco Use   Smoking status: Former   Smokeless tobacco: Never  Building services engineer Use: Never used  Substance and Sexual Activity   Alcohol use: Not Currently    Comment: Prior heavy alcohol use, 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   Drug use: Not Currently   Sexual activity: Not Currently  Other Topics Concern   Not on file  Social History Narrative   Not on file   Social Determinants of Health   Financial Resource Strain: Not on file  Food Insecurity: Not on file  Transportation Needs: Not on file  Physical Activity: Not on file  Stress: Not on file  Social Connections: Not on file  Intimate Partner Violence: Not on file    Outpatient Medications Prior to Visit  Medication Sig Dispense Refill   acetaminophen  (TYLENOL) 500 MG tablet Take 500 mg by mouth every 6 (six) hours as needed for mild pain.     buPROPion (WELLBUTRIN SR) 150 MG 12 hr tablet TAKE 1 TABLET (150 MG TOTAL) BY MOUTH 2 (TWO) TIMES DAILY. 180 tablet 0   busPIRone (BUSPAR) 15 MG tablet Take 1.5 tablets (22.5 mg total) by mouth 2 (two) times daily. 270 tablet 0   Lurasidone HCl 60 MG TABS TAKE 1 TABLET (60 MG TOTAL) BY MOUTH EVERY EVENING. 90 tablet 0   Multiple Vitamin (MULTIVITAMIN WITH MINERALS) TABS tablet Take 1 tablet by mouth daily.     amLODipine (NORVASC) 10 MG tablet TAKE 1 TABLET (10 MG TOTAL) BY MOUTH DAILY. 30 tablet 0   atorvastatin (LIPITOR) 10 MG tablet TAKE 1 TABLET (10 MG TOTAL) BY MOUTH DAILY. 90 tablet 3   Glecaprevir-Pibrentasvir (MAVYRET) 100-40 MG TABS Take 3 tablets by mouth daily with breakfast. 84 tablet 1   No facility-administered medications prior to visit.    No Known Allergies  ROS Review of Systems  Constitutional: Negative.   HENT: Negative.    Eyes: Negative.   Respiratory:  Negative for shortness of breath.   Cardiovascular:  Negative for chest pain.  Gastrointestinal: Negative.   Endocrine: Negative.   Genitourinary: Negative.   Musculoskeletal: Negative.   Skin: Negative.  Allergic/Immunologic: Negative.   Neurological:  Negative for speech difficulty and headaches.  Hematological: Negative.   Psychiatric/Behavioral:  Negative for dysphoric mood, self-injury, sleep disturbance and suicidal ideas. The patient is not nervous/anxious.      Objective:    Physical Exam Vitals and nursing note reviewed.  Constitutional:      Appearance: Normal appearance.  HENT:     Head: Normocephalic and atraumatic.     Right Ear: External ear normal.     Left Ear: External ear normal.     Nose: Nose normal.     Mouth/Throat:     Mouth: Mucous membranes are moist.     Pharynx: Oropharynx is clear.  Eyes:     Extraocular Movements: Extraocular movements intact.     Conjunctiva/sclera:  Conjunctivae normal.     Pupils: Pupils are equal, round, and reactive to light.  Cardiovascular:     Rate and Rhythm: Normal rate and regular rhythm.     Pulses: Normal pulses.     Heart sounds: Normal heart sounds.  Pulmonary:     Effort: Pulmonary effort is normal.     Breath sounds: Normal breath sounds.  Musculoskeletal:        General: Normal range of motion.     Cervical back: Normal range of motion and neck supple.  Skin:    General: Skin is warm and dry.  Neurological:     General: No focal deficit present.     Mental Status: He is alert and oriented to person, place, and time.  Psychiatric:        Mood and Affect: Mood normal.        Behavior: Behavior normal.        Thought Content: Thought content normal.        Judgment: Judgment normal.    BP (!) 166/95 (BP Location: Left Arm, Patient Position: Sitting, Cuff Size: Normal)   Pulse 72   Temp 98.7 F (37.1 C) (Oral)   Resp 18   Ht  (1.753 m)   Wt 180 lb (81.6 kg)   SpO2 98%   BMI 26.58 kg/m  Wt Readings from Last 3 Encounters:  12/19/20 180 lb (81.6 kg)  11/28/20 181 lb (82.1 kg)  08/24/20 180 lb (81.6 kg)     Health Maintenance Due  Topic Date Due   Pneumonia Vaccine 39+ Years old (1 - PCV) Never done   HIV Screening  Never done   TETANUS/TDAP  Never done   Zoster Vaccines- Shingrix (1 of 2) Never done   COVID-19 Vaccine (3 - Booster for Pfizer series) 04/11/2019   INFLUENZA VACCINE  Never done    There are no preventive care reminders to display for this patient.  Lab Results  Component Value Date   TSH 3.710 03/13/2018   Lab Results  Component Value Date   WBC 8.9 12/15/2019   HGB 16.4 12/15/2019   HCT 47.3 12/15/2019   MCV 90 12/15/2019   PLT 303 12/15/2019   Lab Results  Component Value Date   NA 134 12/15/2019   K 4.8 12/15/2019   CO2 25 12/15/2019   GLUCOSE 89 12/15/2019   BUN 10 12/15/2019   CREATININE 0.81 12/15/2019   BILITOT 0.4 12/15/2019   ALKPHOS 106 12/15/2019    AST 15 12/15/2019   ALT 18 12/15/2019   PROT 7.6 12/15/2019   ALBUMIN 4.9 (H) 12/15/2019   CALCIUM 9.9 12/15/2019   ANIONGAP 9 02/11/2018   Lab Results  Component Value  Date   CHOL 231 (H) 12/15/2019   Lab Results  Component Value Date   HDL 47 12/15/2019   Lab Results  Component Value Date   LDLCALC 159 (H) 12/15/2019   Lab Results  Component Value Date   TRIG 136 12/15/2019   Lab Results  Component Value Date   CHOLHDL 4.9 12/15/2019   Lab Results  Component Value Date   HGBA1C 5.3 12/15/2019      Assessment & Plan:   Problem List Items Addressed This Visit       Cardiovascular and Mediastinum   Essential hypertension - Primary   Relevant Medications   amLODipine (NORVASC) 10 MG tablet   atorvastatin (LIPITOR) 10 MG tablet   Other Relevant Orders   CBC with Differential/Platelet   Comp. Metabolic Panel (12)   TSH   Elevated blood pressure reading with diagnosis of hypertension   Relevant Medications   amLODipine (NORVASC) 10 MG tablet   atorvastatin (LIPITOR) 10 MG tablet     Digestive   Chronic hepatitis C without hepatic coma (HCC)     Other   MDD (major depressive disorder)   Generalized anxiety disorder   Mixed hyperlipidemia   Relevant Medications   amLODipine (NORVASC) 10 MG tablet   atorvastatin (LIPITOR) 10 MG tablet   Other Relevant Orders   Lipid panel   Other Visit Diagnoses     Screening for lung cancer       Relevant Orders   CT CHEST LUNG CA SCREEN LOW DOSE W/O CM   Need for shingles vaccine       Relevant Orders   Varicella-zoster vaccine IM (Shingrix)   Screening PSA (prostate specific antigen)       Relevant Orders   PSA       Meds ordered this encounter  Medications   amLODipine (NORVASC) 10 MG tablet    Sig: Take 1 tablet (10 mg total) by mouth daily.    Dispense:  90 tablet    Refill:  1    Order Specific Question:   Supervising Provider    Answer:   Shan Levans E [[id number]]   atorvastatin (LIPITOR) 10 MG  tablet    Sig: TAKE 1 TABLET (10 MG TOTAL) BY MOUTH DAILY.    Dispense:  90 tablet    Refill:  1    Order Specific Question:   Supervising Provider    Answer:   WRIGHT, PATRICK E [[id number]]   1. Essential hypertension Resume amlodipine.  Patient encouraged to check blood pressure at home, keep a written log and have available for all office visits.  Red flags given for prompt reevaluation.  [facility] for fasting labs to be completed in 2 days. - CBC with Differential/Platelet; Future - Comp. Metabolic Panel (12); Future - TSH; Future - amLODipine (NORVASC) 10 MG tablet; Take 1 tablet (10 mg total) by mouth daily.  Dispense: 90 tablet; Refill: 1  2. Elevated blood pressure reading with diagnosis of hypertension   3. Mixed hyperlipidemia Continue current regimen - Lipid panel; Future - atorvastatin (LIPITOR) 10 MG tablet; TAKE 1 TABLET (10 MG TOTAL) BY MOUTH DAILY.  Dispense: 90 tablet; Refill: 1  4. Recurrent major depressive disorder, in remission (HCC) Continue follow-up with behavioral health  5. Generalized anxiety disorder   6. Chronic hepatitis C without hepatic coma (HCC) Has completed treatment with [facility]  7. Screening for lung cancer  - CT CHEST LUNG CA SCREEN LOW DOSE W/O  CM; Future  8. Need for shingles vaccine  - Varicella-zoster vaccine IM (Shingrix); Future  9. Screening PSA (prostate specific antigen)  - PSA; Future   I have reviewed the patient's medical history (PMH, PSH, Social History, Family History, Medications, and allergies) , and have been updated if relevant. I spent 30 minutes reviewing chart and  face to face time with patient.    Follow-up: Return in about 2 days (around 12/21/2020) for Fasting  labs, At [facility].    Kasandra Knudsen Mayers, PA-C

## 2020-12-21 ENCOUNTER — Ambulatory Visit: Payer: Medicare Other | Attending: Physician Assistant

## 2020-12-21 ENCOUNTER — Other Ambulatory Visit: Payer: Self-pay

## 2020-12-21 DIAGNOSIS — Z23 Encounter for immunization: Secondary | ICD-10-CM

## 2020-12-21 DIAGNOSIS — E782 Mixed hyperlipidemia: Secondary | ICD-10-CM

## 2020-12-21 DIAGNOSIS — Z125 Encounter for screening for malignant neoplasm of prostate: Secondary | ICD-10-CM

## 2020-12-21 DIAGNOSIS — I10 Essential (primary) hypertension: Secondary | ICD-10-CM

## 2020-12-21 MED ORDER — ZOSTER VAC RECOMB ADJUVANTED 50 MCG/0.5ML IM SUSR: 0.5000 mL | Freq: Once | INTRAMUSCULAR | 1 refills | Status: AC

## 2020-12-22 LAB — COMP. METABOLIC PANEL (12)
AST: 20 IU/L (ref 0–40)
Albumin/Globulin Ratio: 1.7 (ref 1.2–2.2)
Albumin: 4.7 g/dL (ref 3.8–4.8)
Alkaline Phosphatase: 118 IU/L (ref 44–121)
BUN/Creatinine Ratio: 10 (ref 10–24)
BUN: 12 mg/dL (ref 8–27)
Bilirubin Total: 0.4 mg/dL (ref 0.0–1.2)
Calcium: 9.2 mg/dL (ref 8.6–10.2)
Chloride: 96 mmol/L (ref 96–106)
Creatinine, Ser: 1.22 mg/dL (ref 0.76–1.27)
Globulin, Total: 2.7 g/dL (ref 1.5–4.5)
Glucose: 106 mg/dL — ABNORMAL HIGH (ref 70–99)
Potassium: 4.7 mmol/L (ref 3.5–5.2)
Sodium: 132 mmol/L — ABNORMAL LOW (ref 134–144)
Total Protein: 7.4 g/dL (ref 6.0–8.5)
eGFR: 66 mL/min/{1.73_m2} (ref >59)

## 2020-12-22 LAB — CBC WITH DIFFERENTIAL/PLATELET
Basophils Absolute: 0 10*3/uL (ref 0.0–0.2)
Basos: 1 %
EOS (ABSOLUTE): 0.1 10*3/uL (ref 0.0–0.4)
Eos: 2 %
Hematocrit: 45.1 % (ref 37.5–51.0)
Hemoglobin: 15.6 g/dL (ref 13.0–17.7)
Immature Grans (Abs): 0 10*3/uL (ref 0.0–0.1)
Immature Granulocytes: 0 %
Lymphocytes Absolute: 1.8 10*3/uL (ref 0.7–3.1)
Lymphs: 23 %
MCH: 31 pg (ref 26.6–33.0)
MCHC: 34.6 g/dL (ref 31.5–35.7)
MCV: 90 fL (ref 79–97)
Monocytes Absolute: 0.7 10*3/uL (ref 0.1–0.9)
Monocytes: 9 %
Neutrophils Absolute: 4.9 10*3/uL (ref 1.4–7.0)
Neutrophils: 65 %
Platelets: 306 10*3/uL (ref 150–450)
RBC: 5.04 x10E6/uL (ref 4.14–5.80)
RDW: 11.8 % (ref 11.6–15.4)
WBC: 7.5 10*3/uL (ref 3.4–10.8)

## 2020-12-22 LAB — LIPID PANEL
Chol/HDL Ratio: 2.8 ratio (ref 0.0–5.0)
Cholesterol, Total: 149 mg/dL (ref 100–199)
HDL: 54 mg/dL (ref >39)
LDL Chol Calc (NIH): 83 mg/dL (ref 0–99)
Triglycerides: 60 mg/dL (ref 0–149)
VLDL Cholesterol Cal: 12 mg/dL (ref 5–40)

## 2020-12-22 LAB — TSH: TSH: 2.15 u[IU]/mL (ref 0.450–4.500)

## 2020-12-22 LAB — PSA: Prostate Specific Ag, Serum: 1.3 ng/mL (ref 0.0–4.0)

## 2020-12-28 ENCOUNTER — Telehealth: Payer: Self-pay | Admitting: *Deleted

## 2020-12-28 NOTE — Telephone Encounter (Signed)
MA UTR patient due to no service on main contact and no answer x2 on mobile. LVM to return a call for any concerns at [phone]. Patient has viewed results via mychart.

## 2020-12-28 NOTE — Telephone Encounter (Signed)
-----   Message from Roney Jaffe, [place] sent at 12/25/2020 12:53 PM EST ----- Please call patient and let him know that his kidney and liver function are within normal limits, his sodium level is slightly below normal limits, this should continue to be monitored.  His cholesterol is well controlled with his cholesterol medication, his thyroid is within normal limits and his screening for prostate cancer was negative.  He does not show signs of anemia

## 2021-01-09 ENCOUNTER — Other Ambulatory Visit: Payer: Self-pay

## 2021-01-10 ENCOUNTER — Other Ambulatory Visit: Payer: Self-pay

## 2021-01-11 ENCOUNTER — Other Ambulatory Visit: Payer: Self-pay

## 2021-02-06 ENCOUNTER — Other Ambulatory Visit: Payer: Self-pay

## 2021-02-07 ENCOUNTER — Other Ambulatory Visit: Payer: Self-pay

## 2021-02-07 ENCOUNTER — Telehealth (HOSPITAL_COMMUNITY): Payer: Self-pay

## 2021-02-07 MED ORDER — ARIPIPRAZOLE 10 MG PO TABS
10.0000 mg | ORAL_TABLET | Freq: Every day | ORAL | 0 refills | Status: DC
  Filled 2021-02-07: qty 30, 30d supply, fill #0

## 2021-02-07 NOTE — Telephone Encounter (Signed)
Tyrone Nielsen is here in the office today-Per Shawn, pharmacies don't have the generic in stock as of yet. Patient uses [facility] and they also do not have it in stock yet.

## 2021-02-07 NOTE — Telephone Encounter (Signed)
Tried to call patient back twice. No answer and could not leave vm. I will try back at a later time

## 2021-02-07 NOTE — Telephone Encounter (Signed)
Patient states that he got a letter from insurance that he would have to start paying $500 for Latuda. He was using a prescription card. He would like to know what to do because he can not afford it?  CB# [phone]

## 2021-02-07 NOTE — Telephone Encounter (Signed)
Patient called back and I explained everything to him that is in Dr. Gilmore Laroche message. He will call if any concerns.

## 2021-02-12 ENCOUNTER — Other Ambulatory Visit: Payer: Self-pay

## 2021-02-14 ENCOUNTER — Other Ambulatory Visit: Payer: Self-pay

## 2021-02-14 ENCOUNTER — Encounter: Payer: Self-pay | Admitting: Nurse Practitioner

## 2021-02-14 ENCOUNTER — Ambulatory Visit: Payer: Medicare Other | Attending: Nurse Practitioner | Admitting: Nurse Practitioner

## 2021-02-14 DIAGNOSIS — I10 Essential (primary) hypertension: Secondary | ICD-10-CM

## 2021-02-14 MED ORDER — AMLODIPINE BESYLATE 10 MG PO TABS
10.0000 mg | ORAL_TABLET | Freq: Every day | ORAL | 1 refills | Status: DC
  Filled 2021-02-14 – 2021-03-12 (×2): qty 90, 90d supply, fill #0
  Filled 2021-06-07: qty 90, 90d supply, fill #1

## 2021-02-14 NOTE — Progress Notes (Signed)
Tyrone Nielsen is not answered currently indicated "I feel like resting makes the pain so possible but most people in my family some of so and I know it is hard to get that is no different to me than drugs and alcohol nicotine.  Alcohol were excluded so except  Assessment & Plan:  Tyrone Nielsen was seen today for hypertension.  Diagnoses and all orders for this visit:  Essential hypertension -     amLODipine (NORVASC) 10 MG tablet; Take 1 tablet (10 mg total) by mouth daily.    Patient has been counseled on age-appropriate routine health concerns for screening and prevention. These are reviewed and up-to-date. Referrals have been placed accordingly. Immunizations are up-to-date or declined.    Subjective:   Chief Complaint  Patient presents with   Hypertension   HPI Tyrone Nielsen 66 y.o. male presents to office today for f/u to HTN.  Doing well today. Has no concerns. Blood pressure is well controlled.  He has a past medical history of Anxiety, Depression, ETOH abuse and Hepatitis C.   He is seeing a behavioral health specialist for his depression and anxiety.  Blood pressure is slightly elevated. He is taking amlodipine as prescribed. If still elevated at next visit will need to add additional blood pressure med.  BP Readings from Last 3 Encounters:  02/14/21 (!) 137/92  12/19/20 (!) 166/95  03/17/20 116/76      Review of Systems  Constitutional:  Negative for fever, malaise/fatigue and weight loss.  HENT: Negative.  Negative for nosebleeds.   Eyes: Negative.  Negative for blurred vision, double vision and photophobia.  Respiratory: Negative.  Negative for cough and shortness of breath.   Cardiovascular: Negative.  Negative for chest pain, palpitations and leg swelling.  Gastrointestinal: Negative.  Negative for heartburn, nausea and vomiting.  Musculoskeletal: Negative.  Negative for myalgias.  Neurological: Negative.  Negative for dizziness, focal weakness, seizures and  headaches.  Psychiatric/Behavioral:  Positive for depression. Negative for suicidal ideas. The patient is nervous/anxious.    Past Medical History:  Diagnosis Date   Anxiety    Depression    Hepatitis C     History reviewed. No pertinent surgical history.  Family History  Problem Relation Age of Onset   Hypertension Neg Hx    Intellectual disability Neg Hx     Social History Reviewed with no changes to be made today.   Outpatient Medications Prior to Visit  Medication Sig Dispense Refill   acetaminophen (TYLENOL) 500 MG tablet Take 500 mg by mouth every 6 (six) hours as needed for mild pain.     ARIPiprazole (ABILIFY) 10 MG tablet Take 1 tablet (10 mg total) by mouth daily. Start half tablet a day for 2 days and then take one a day 30 tablet 0   atorvastatin (LIPITOR) 10 MG tablet TAKE 1 TABLET (10 MG TOTAL) BY MOUTH DAILY. 90 tablet 1   buPROPion (WELLBUTRIN SR) 150 MG 12 hr tablet TAKE 1 TABLET (150 MG TOTAL) BY MOUTH 2 (TWO) TIMES DAILY. 180 tablet 0   busPIRone (BUSPAR) 15 MG tablet Take 1.5 tablets (22.5 mg total) by mouth 2 (two) times daily. 270 tablet 0   Multiple Vitamin (MULTIVITAMIN WITH MINERALS) TABS tablet Take 1 tablet by mouth daily.     amLODipine (NORVASC) 10 MG tablet Take 1 tablet (10 mg total) by mouth daily. 90 tablet 1   Lurasidone HCl 60 MG TABS TAKE 1 TABLET (60 MG TOTAL) BY MOUTH EVERY EVENING. 90 tablet 0  No facility-administered medications prior to visit.    No Known Allergies     Objective:    BP (!) 137/92    Pulse 77    Ht 5\' 9"  (1.753 m)    Wt 185 lb 2 oz (84 kg)    SpO2 97%    BMI 27.34 kg/m  Wt Readings from Last 3 Encounters:  02/14/21 185 lb 2 oz (84 kg)  12/19/20 180 lb (81.6 kg)  12/24/19 180 lb (81.6 kg)    Physical Exam Vitals and nursing note reviewed.  Constitutional:      Appearance: He is well-developed.  HENT:     Head: Normocephalic and atraumatic.  Cardiovascular:     Rate and Rhythm: Normal rate and regular  rhythm.     Heart sounds: Normal heart sounds. No murmur heard.   No friction rub. No gallop.  Pulmonary:     Effort: Pulmonary effort is normal. No tachypnea or respiratory distress.     Breath sounds: Normal breath sounds. No decreased breath sounds, wheezing, rhonchi or rales.  Chest:     Chest wall: No tenderness.  Abdominal:     General: Bowel sounds are normal.     Palpations: Abdomen is soft.  Musculoskeletal:        General: Normal range of motion.     Cervical back: Normal range of motion.  Skin:    General: Skin is warm and dry.  Neurological:     Mental Status: He is alert and oriented to person, place, and time.     Coordination: Coordination normal.  Psychiatric:        Behavior: Behavior normal. Behavior is cooperative.        Thought Content: Thought content normal.        Judgment: Judgment normal.         Patient has been counseled extensively about nutrition and exercise as well as the importance of adherence with medications and regular follow-up. The patient was given clear instructions to go to ER or return to medical center if symptoms don't improve, worsen or new problems develop. The patient verbalized understanding.   Follow-up: Return in about 6 months (around 08/14/2021) for PHYSICAL.   10/14/2021, FNP-BC [facility] [place], [place]   02/14/2021, 2:37 PM

## 2021-02-27 ENCOUNTER — Other Ambulatory Visit: Payer: Self-pay

## 2021-02-27 ENCOUNTER — Encounter (HOSPITAL_COMMUNITY): Payer: Self-pay | Admitting: Psychiatry

## 2021-02-27 ENCOUNTER — Ambulatory Visit (INDEPENDENT_AMBULATORY_CARE_PROVIDER_SITE_OTHER): Payer: Medicare Other | Admitting: Psychiatry

## 2021-02-27 VITALS — BP 120/76 | Temp 98.0°F | Ht 69.0 in | Wt 186.0 lb

## 2021-02-27 DIAGNOSIS — F1121 Opioid dependence, in remission: Secondary | ICD-10-CM

## 2021-02-27 DIAGNOSIS — F411 Generalized anxiety disorder: Secondary | ICD-10-CM

## 2021-02-27 DIAGNOSIS — F3340 Major depressive disorder, recurrent, in remission, unspecified: Secondary | ICD-10-CM

## 2021-02-27 MED ORDER — BUSPIRONE HCL 15 MG PO TABS
22.5000 mg | ORAL_TABLET | Freq: Two times a day (BID) | ORAL | 0 refills | Status: DC
  Filled 2021-02-27 – 2021-03-12 (×2): qty 270, 90d supply, fill #0

## 2021-02-27 MED ORDER — ARIPIPRAZOLE 10 MG PO TABS
10.0000 mg | ORAL_TABLET | Freq: Every day | ORAL | 2 refills | Status: DC
  Filled 2021-02-27 – 2021-03-12 (×2): qty 30, 30d supply, fill #0
  Filled 2021-04-09: qty 30, 30d supply, fill #1
  Filled 2021-05-07: qty 30, 30d supply, fill #2

## 2021-02-27 MED ORDER — BUPROPION HCL ER (SR) 150 MG PO TB12
ORAL_TABLET | Freq: Two times a day (BID) | ORAL | 0 refills | Status: DC
  Filled 2021-02-27: qty 180, fill #0
  Filled 2021-03-12 (×2): qty 180, 90d supply, fill #0

## 2021-02-27 NOTE — Progress Notes (Signed)
[facility] Follow up visit  Patient Identification: Tyrone Nielsen MRN:  [id number] Date of Evaluation:  02/27/2021 Referral Source: [facility] Chief Complaint:  establish care, depression Visit Diagnosis:    ICD-10-CM   1. Opioid dependence in remission (HCC)  F11.21     2. Recurrent major depressive disorder, in remission (HCC)  F33.40 buPROPion (WELLBUTRIN SR) 150 MG 12 hr tablet    3. 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  Meds keeping balance with anxiety buspar is twice a day    He has remote history of using cocaine and other opioids as well remained sober for the last 20 years    Aggravating factors; difficult childhood, Modifying factors; goes to church, keeps busy, talks with mom Severity not worse   Duration : adult life .    Past Psychiatric History: depression, anxiety, alcohol use  Previous Psychotropic Medications: Yes   Substance Abuse History in the last 12 months:  No.  Consequences of Substance Abuse: NA  Past Medical History:  Past Medical History:  Diagnosis Date   Anxiety    Depression    Hepatitis C    History reviewed. No pertinent surgical history.  Family Psychiatric History: nephew, schizoaffective disorder  Family History:  Family History  Problem Relation Age of Onset   Hypertension Neg Hx    Intellectual disability Neg Hx     Social History:   Social History   Socioeconomic History   Marital status: Divorced    Spouse name: Not on file   Number of children:  1   Years of education: 12   Highest education level: Not on file  Occupational History   Occupation: Retired    Comment: Contstruction  Tobacco Use   Smoking status: Former   Smokeless tobacco: Never  Building services engineer Use: Never used  Substance and Sexual Activity   Alcohol use: Not Currently    Comment: Prior heavy alcohol use, 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   Drug use: Not Currently   Sexual activity: Not Currently  Other Topics Concern   Not on file  Social History Narrative   Not on file   Social Determinants of Health   Financial Resource Strain: Not on file  Food Insecurity: Not on file  Transportation Needs: Not on file  Physical Activity: Not on file  Stress: Not on file  Social Connections: Not on file     Allergies:  No Known Allergies  Metabolic Disorder Labs: Lab Results  Component Value Date   HGBA1C 5.3 12/15/2019   No results found for: PROLACTIN Lab Results  Component Value Date   CHOL 149 12/21/2020   TRIG 60 12/21/2020   HDL 54 12/21/2020   CHOLHDL 2.8 12/21/2020   LDLCALC 83 12/21/2020   LDLCALC 159 (H) 12/15/2019   Lab Results  Component Value Date   TSH 2.150 12/21/2020    Therapeutic Level Labs: No results found for: LITHIUM No results found for: CBMZ No results found for: VALPROATE  Current Medications: Current Outpatient Medications  Medication Sig  Dispense Refill   acetaminophen (TYLENOL) 500 MG tablet Take 500 mg by mouth every 6 (six) hours as needed for mild pain.     amLODipine (NORVASC) 10 MG tablet Take 1 tablet (10 mg total) by mouth daily. 90 tablet 1   ARIPiprazole (ABILIFY) 10 MG tablet Take 1 tablet (10 mg total) by mouth daily. Take one a day 30 tablet 2   atorvastatin (LIPITOR) 10 MG tablet TAKE 1 TABLET (10 MG TOTAL) BY MOUTH DAILY. 90 tablet 1   buPROPion (WELLBUTRIN SR) 150 MG 12 hr tablet TAKE 1 TABLET (150 MG TOTAL) BY MOUTH 2 (TWO) TIMES DAILY. 180 tablet 0   busPIRone (BUSPAR) 15 MG tablet Take 1.5 tablets (22.5 mg  total) by mouth 2 (two) times daily. 270 tablet 0   Multiple Vitamin (MULTIVITAMIN WITH MINERALS) TABS tablet Take 1 tablet by mouth daily.     No current facility-administered medications for this visit.     Psychiatric Specialty Exam: Review of Systems  Cardiovascular:  Negative for chest pain.  Neurological:  Negative for tremors.  Psychiatric/Behavioral:  Negative for agitation and dysphoric mood.    Blood pressure 120/76, temperature 98 F (36.7 C), height 5\' 9"  (1.753 m), weight 186 lb (84.4 kg).Body mass index is 27.47 kg/m.  General Appearance: Casual  Eye Contact:  Fair  Speech:  Clear and Coherent  Volume:  Decreased  Mood:  Euthymic  Affect:  Congruent  Thought Process:  Goal Directed  Orientation:  Full (Time, Place, and Person)  Thought Content:  Rumination  Suicidal Thoughts:  No  Homicidal Thoughts:  No  Memory:  Immediate;   Fair  Judgement:  Fair  Insight:  Fair  Psychomotor Activity:  Normal  Concentration:  Concentration: Fair  Recall:  Good  Fund of Knowledge:Good  Language: Good  Akathisia:  No  Handed:    AIMS (if indicated):  no involuntary movements  Assets:  Housing Physical Health  ADL's:  Intact  Cognition: WNL  Sleep:  Fair   Screenings: GAD-7    Flowsheet Row Office Visit from 02/14/2021 in [facility] Video Visit from 07/04/2020 in [facility] Video Visit from 03/30/2020 in [facility] Office Visit from 12/15/2019 in [facility] Office Visit from 03/23/2018 in [facility]  Total GAD-7 Score 6 9 5 1 12       PHQ2-9    Flowsheet Row Office Visit from 02/14/2021 in [facility] Office Visit from 08/24/2020 in [facility] [place] Video Visit from 07/04/2020 in [facility] Video Visit from 03/30/2020 in [facility] Office Visit from 12/15/2019 in [facility]  PHQ-2 Total Score 2 1 2 1 3   PHQ-9 Total Score 3 6 11  -- 4      Flowsheet Row Office Visit from 02/27/2021 in [facility] [place] Office Visit from 11/28/2020 in [facility] AT [place] Office Visit from 08/24/2020 in [facility] AT [place]  C-SSRS RISK CATEGORY No Risk No Risk No Risk       Assessment and Plan: as follows  Prior documentation reviewed  MDD recurrent moderate: in  remission, doing fair continue wellbutrin , abilify  GAD manageable continue buspar  Opiod and cocaine use: remains sober, denies craving, discussed relapse prevention  Spent in office  face to fact time : 20 min Fu 2 -38m Thresa Ross, MD 2/14/202310:16 AM

## 2021-03-12 ENCOUNTER — Other Ambulatory Visit: Payer: Self-pay

## 2021-03-13 ENCOUNTER — Other Ambulatory Visit: Payer: Self-pay

## 2021-04-09 ENCOUNTER — Other Ambulatory Visit: Payer: Self-pay

## 2021-04-13 ENCOUNTER — Other Ambulatory Visit: Payer: Self-pay

## 2021-05-07 ENCOUNTER — Other Ambulatory Visit: Payer: Self-pay

## 2021-05-08 ENCOUNTER — Other Ambulatory Visit: Payer: Self-pay

## 2021-05-09 ENCOUNTER — Other Ambulatory Visit: Payer: Self-pay

## 2021-06-05 ENCOUNTER — Encounter (HOSPITAL_COMMUNITY): Payer: Self-pay | Admitting: Psychiatry

## 2021-06-05 ENCOUNTER — Telehealth (HOSPITAL_COMMUNITY): Payer: Self-pay | Admitting: Psychiatry

## 2021-06-05 ENCOUNTER — Ambulatory Visit: Payer: Medicare Other | Attending: Nurse Practitioner | Admitting: Nurse Practitioner

## 2021-06-05 ENCOUNTER — Encounter: Payer: Self-pay | Admitting: Nurse Practitioner

## 2021-06-05 ENCOUNTER — Ambulatory Visit (INDEPENDENT_AMBULATORY_CARE_PROVIDER_SITE_OTHER): Payer: Medicare Other | Admitting: Psychiatry

## 2021-06-05 ENCOUNTER — Other Ambulatory Visit: Payer: Self-pay

## 2021-06-05 DIAGNOSIS — R195 Other fecal abnormalities: Secondary | ICD-10-CM

## 2021-06-05 DIAGNOSIS — F411 Generalized anxiety disorder: Secondary | ICD-10-CM | POA: Diagnosis not present

## 2021-06-05 DIAGNOSIS — F3340 Major depressive disorder, recurrent, in remission, unspecified: Secondary | ICD-10-CM

## 2021-06-05 DIAGNOSIS — Z0000 Encounter for general adult medical examination without abnormal findings: Secondary | ICD-10-CM

## 2021-06-05 DIAGNOSIS — Z1211 Encounter for screening for malignant neoplasm of colon: Secondary | ICD-10-CM | POA: Diagnosis not present

## 2021-06-05 MED ORDER — BUPROPION HCL ER (SR) 150 MG PO TB12
ORAL_TABLET | Freq: Two times a day (BID) | ORAL | 3 refills | Status: DC
  Filled 2021-06-05: qty 180, 90d supply, fill #0
  Filled 2021-06-05: qty 60, 30d supply, fill #0
  Filled 2021-06-07: qty 180, 90d supply, fill #0

## 2021-06-05 MED ORDER — ARIPIPRAZOLE 10 MG PO TABS
10.0000 mg | ORAL_TABLET | Freq: Every day | ORAL | 1 refills | Status: DC
  Filled 2021-06-05: qty 30, 30d supply, fill #0
  Filled 2021-07-09: qty 30, 30d supply, fill #1

## 2021-06-05 MED ORDER — BUSPIRONE HCL 15 MG PO TABS
22.5000 mg | ORAL_TABLET | Freq: Two times a day (BID) | ORAL | 0 refills | Status: DC
  Filled 2021-06-05: qty 90, 30d supply, fill #0
  Filled 2021-06-05 – 2021-06-07 (×2): qty 270, 90d supply, fill #0

## 2021-06-05 MED ORDER — LURASIDONE HCL 60 MG PO TABS
60.0000 mg | ORAL_TABLET | Freq: Every day | ORAL | 0 refills | Status: DC
  Filled 2021-06-05 (×2): qty 30, 30d supply, fill #0

## 2021-06-05 NOTE — Patient Instructions (Signed)
Benign Prostatic Hyperplasia  Benign prostatic hyperplasia (BPH) is an enlarged prostate gland that is caused by the normal aging process. The prostate may get bigger as a man gets older. The condition is not caused by cancer. The prostate is a walnut-sized gland that is involved in the production of semen. It is located in front of the rectum and below the bladder. The bladder stores urine. The urethra carries stored urine out of the body. An enlarged prostate can press on the urethra. This can make it harder to pass urine. The buildup of urine in the bladder can cause infection. Back pressure and infection may progress to bladder damage and kidney (renal) failure. What are the causes? This condition is part of the normal aging process. However, not all men develop problems from this condition. If the prostate enlarges away from the urethra, urine flow will not be blocked. If it enlarges toward the urethra and compresses it, there will be problems passing urine. What increases the risk? This condition is more likely to develop in men older than 50 years. What are the signs or symptoms? Symptoms of this condition include: Getting up often during the night to urinate. Needing to urinate frequently during the day. Difficulty starting urine flow. Decrease in size and strength of your urine stream. Leaking (dribbling) after urinating. Inability to pass urine. This needs immediate treatment. Inability to completely empty your bladder. Pain when you pass urine. This is more common if there is also an infection. Urinary tract infection (UTI). How is this diagnosed? This condition is diagnosed based on your medical history, a physical exam, and your symptoms. Tests will also be done, such as: A post-void bladder scan. This measures any amount of urine that may remain in your bladder after you finish urinating. A digital rectal exam. In a rectal exam, your health care provider checks your prostate by  putting a lubricated, gloved finger into your rectum to feel the back of your prostate gland. This exam detects the size of your gland and any abnormal lumps or growths. An exam of your urine (urinalysis). A prostate specific antigen (PSA) screening. This is a blood test used to screen for prostate cancer. An ultrasound. This test uses sound waves to electronically produce a picture of your prostate gland. Your health care provider may refer you to a specialist in kidney and prostate diseases (urologist). How is this treated? Once symptoms begin, your health care provider will monitor your condition (active surveillance or watchful waiting). Treatment for this condition will depend on the severity of your condition. Treatment may include: Observation and yearly exams. This may be the only treatment needed if your condition and symptoms are mild. Medicines to relieve your symptoms, including: Medicines to shrink the prostate. Medicines to relax the muscle of the prostate. Surgery in severe cases. Surgery may include: Prostatectomy. In this procedure, the prostate tissue is removed completely through an open incision or with a laparoscope or robotics. Transurethral resection of the prostate (TURP). In this procedure, a tool is inserted through the opening at the tip of the penis (urethra). It is used to cut away tissue of the inner core of the prostate. The pieces are removed through the same opening of the penis. This removes the blockage. Transurethral incision (TUIP). In this procedure, small cuts are made in the prostate. This lessens the prostate's pressure on the urethra. Transurethral microwave thermotherapy (TUMT). This procedure uses microwaves to create heat. The heat destroys and removes a small amount of   prostate tissue. Transurethral needle ablation (TUNA). This procedure uses radio frequencies to destroy and remove a small amount of prostate tissue. Interstitial laser coagulation (ILC).  This procedure uses a laser to destroy and remove a small amount of prostate tissue. Transurethral electrovaporization (TUVP). This procedure uses electrodes to destroy and remove a small amount of prostate tissue. Prostatic urethral lift. This procedure inserts an implant to push the lobes of the prostate away from the urethra. Follow these instructions at home: Take over-the-counter and prescription medicines only as told by your health care provider. Monitor your symptoms for any changes. Contact your health care provider with any changes. Avoid drinking large amounts of liquid before going to bed or out in public. Avoid or reduce how much caffeine or alcohol you drink. Give yourself time when you urinate. Keep all follow-up visits. This is important. Contact a health care provider if: You have unexplained back pain. Your symptoms do not get better with treatment. You develop side effects from the medicine you are taking. Your urine becomes very dark or has a bad smell. Your lower abdomen becomes distended and you have trouble passing urine. Get help right away if: You have a fever or chills. You suddenly cannot urinate. You feel light-headed or very dizzy, or you faint. There are large amounts of blood or clots in your urine. Your urinary problems become hard to manage. You develop moderate to severe low back or flank pain. The flank is the side of your body between the ribs and the hip. These symptoms may be an emergency. Get help right away. Call 911. Do not wait to see if the symptoms will go away. Do not drive yourself to the hospital. Summary Benign prostatic hyperplasia (BPH) is an enlarged prostate that is caused by the normal aging process. It is not caused by cancer. An enlarged prostate can press on the urethra. This can make it hard to pass urine. This condition is more likely to develop in men older than 50 years. Get help right away if you suddenly cannot urinate. This  information is not intended to replace advice given to you by your health care provider. Make sure you discuss any questions you have with your health care provider. Document Revised: 07/19/2020 Document Reviewed: 07/19/2020 Elsevier Patient Education  2023 [employer]  Colonoscopy, Adult A colonoscopy is a procedure to look at the entire large intestine. This procedure is done using a long, thin, flexible tube that has a camera on the end. You may have a colonoscopy: As a part of normal colorectal screening. If you have certain symptoms, such as: A low number of red blood cells in your blood (anemia). Diarrhea that does not go away. Pain in your abdomen. Blood in your stool. A colonoscopy can help screen for and diagnose medical problems, including: An abnormal growth of cells or tissue (tumor). Abnormal growths within the lining of your intestine (polyps). Inflammation. Areas of bleeding. Tell your health care provider about: Any allergies you have. All medicines you are taking, including vitamins, herbs, eye drops, creams, and over-the-counter medicines. Any problems you or family members have had with anesthetic medicines. Any bleeding problems you have. Any surgeries you have had. Any medical conditions you have. Any problems you have had with having bowel movements. Whether you are pregnant or may be pregnant. What are the risks? Generally, this is a safe procedure. However, problems may occur, including: Bleeding. Damage to your intestine. Allergic reactions to medicines given during the procedure. Infection.  This is rare. What happens before the procedure? Eating and drinking restrictions Follow instructions from your health care provider about eating or drinking restrictions, which may include: A few days before the procedure: Follow a low-fiber diet. Avoid nuts, seeds, dried fruit, raw fruits, and vegetables. 1-3 days before the procedure: Eat only gelatin dessert  or ice pops. Drink only clear liquids, such as water, clear juice, clear broth or bouillon, black coffee or tea, or clear soft drinks or sports drinks. Avoid liquids that contain red or purple dye. The day of the procedure: Do not eat solid foods. You may continue to drink clear liquids until up to 2 hours before the procedure. Do not eat or drink anything starting 2 hours before the procedure, or within the time period that your health care provider recommends. Bowel prep If you were prescribed a bowel prep to take by mouth (orally) to clean out your colon: Take it as told by your health care provider. Starting the day before your procedure, you will need to drink a large amount of liquid medicine. The liquid will cause you to have many bowel movements of loose stool until your stool becomes almost clear or light green. If your skin or the opening between the buttocks (anus) gets irritated from diarrhea, you may relieve the irritation using: Wipes with medicine in them, such as adult wet wipes with aloe and vitamin E. A product to soothe skin, such as petroleum jelly. If you vomit while drinking the bowel prep: Take a break for up to 60 minutes. Begin the bowel prep again. Call your health care provider if you keep vomiting or you cannot take the bowel prep without vomiting. To clean out your colon, you may also be given: Laxative medicines. These help you have a bowel movement. Instructions for enema use. An enema is liquid medicine injected into your rectum. Medicines Ask your health care provider about: Changing or stopping your regular medicines or supplements. This is especially important if you are taking iron supplements, diabetes medicines, or blood thinners. Taking medicines such as aspirin and ibuprofen. These medicines can thin your blood. Do not take these medicines unless your health care provider tells you to take them. Taking over-the-counter medicines, vitamins, herbs, and  supplements. General instructions Ask your health care provider what steps will be taken to help prevent infection. These may include washing skin with a germ-killing soap. If you will be going home right after the procedure, plan to have a responsible adult: Take you home from the hospital or clinic. You will not be allowed to drive. Care for you for the time you are told. What happens during the procedure?  An IV will be inserted into one of your veins. You will be given a medicine to make you fall asleep (general anesthetic). You will lie on your side with your knees bent. A lubricant will be put on the tube. Then the tube will be: Inserted into your anus. Gently eased through all parts of your large intestine. Air will be sent into your colon to keep it open. This may cause some pressure or cramping. Images will be taken with the camera and will appear on a screen. A small tissue sample may be removed to be looked at under a microscope (biopsy). The tissue may be sent to a lab for testing if any signs of problems are found. If small polyps are found, they may be removed and checked for cancer cells. When the procedure is finished, the  tube will be removed. The procedure may vary among health care providers and hospitals. What happens after the procedure? Your blood pressure, heart rate, breathing rate, and blood oxygen level will be monitored until you leave the hospital or clinic. You may have a small amount of blood in your stool. You may pass gas and have mild cramping or bloating in your abdomen. This is caused by the air that was used to open your colon during the exam. If you were given a sedative during the procedure, it can affect you for several hours. Do not drive or operate machinery until your health care provider says that it is safe. It is up to you to get the results of your procedure. Ask your health care provider, or the department that is doing the procedure, when your  results will be ready. Summary A colonoscopy is a procedure to look at the entire large intestine. Follow instructions from your health care provider about eating and drinking before the procedure. If you were prescribed an oral bowel prep to clean out your colon, take it as told by your health care provider. During the colonoscopy, a flexible tube with a camera on its end is inserted into the anus and then passed into all parts of the large intestine. This information is not intended to replace advice given to you by your health care provider. Make sure you discuss any questions you have with your health care provider. Document Revised: 12/25/2020 Document Reviewed: 08/23/2020 Elsevier Patient Education  2023 [employer]  Fat and Cholesterol Restricted Eating Plan Eating a diet that limits fat and cholesterol may help lower your risk for heart disease and other conditions. Your body needs fat and cholesterol for basic functions, but eating too much of these things can be harmful to your health. Your health care provider may order lab tests to check your blood fat (lipid) and cholesterol levels. This helps your health care provider understand your risk for certain conditions and whether you need to make diet changes. Work with your health care provider or dietitian to make an eating plan that is right for you. Your plan includes: Limit your fat intake to ______% or less of your total calories a day. This is ______g of fat per day. Limit your saturated fat intake to ______% or less of your total calories a day. This is ______g of saturated fat per day. Limit the amount of cholesterol in your diet to less than _________mg a day. Eat ___________ g of fiber a day. What are tips for following this plan? General guidelines If you are overweight, work with your health care provider to lose weight safely. Losing just 5-10% of your body weight can improve your overall health and help prevent diseases  such as diabetes and heart disease. Avoid: Foods with added sugar. Fried foods. Foods that contain partially hydrogenated oils, including stick margarine, some tub margarines, cookies, crackers, and other baked goods. If you drink alcohol: Limit how much you have to: 0-1 drink a day for women who are not pregnant. 0-2 drinks a day for men. Know how much alcohol is in a drink. In the [place] one drink equals one 12 oz bottle of beer (355 mL), one 5 oz glass of wine (148 mL), or one 1 oz glass of hard liquor (44 mL). Reading food labels Check food labels for: Trans fats or partially hydrogenated oils. Avoid foods that contain these. High amounts of saturated fat. Choose foods that are low in saturated  fat (less than 2 g). The amount of cholesterol in each serving. The amount of fiber in each serving. Choose foods with healthy fats, such as: Monounsaturated and polyunsaturated fats. These include olive and canola oil, flaxseeds, walnuts, almonds, and seeds. Omega-3 fats. These are found in foods such as salmon, mackerel, sardines, tuna, flaxseed oil, and ground flaxseeds. Choose grain products that have whole grains. Look for the word "whole" as the first word in the ingredient list. Cooking Cook foods using methods other than frying. Baking, boiling, grilling, and broiling are some healthy options. Eat more home-cooked food and less restaurant, buffet, and fast food. Avoid cooking using saturated fats. Animal sources of saturated fats include meats, butter, and cream. Plant sources of saturated fats include palm oil, palm kernel oil, and coconut oil. Meal planning  At meals, imagine dividing your plate into fourths: Fill one-half of your plate with vegetables, green salads, and fruit. Fill one-fourth of your plate with whole grains. Fill one-fourth of your plate with lean protein foods. Eat fish that is high in omega-3 fats at least two times a week. Eat more foods that contain fiber,  such as whole grains, beans, apples, pears, berries, broccoli, carrots, peas, and barley. These foods help promote healthy cholesterol levels in the blood. What foods should I eat? Fruits All fresh, canned (in natural juice), or frozen fruits. Vegetables Fresh or frozen vegetables (raw, steamed, roasted, or grilled). Green salads. Grains Whole grains, such as whole wheat or whole grain breads, crackers, cereals, and pasta. Unsweetened oatmeal, bulgur, barley, quinoa, or brown rice. Corn or whole wheat flour tortillas. Meats and other proteins Ground beef (85% or leaner), grass-fed beef, or beef trimmed of fat. Skinless chicken or [place]. Ground chicken or [place]. Pork trimmed of fat. All fish and seafood. Egg whites. Dried beans, peas, or lentils. Unsalted nuts or seeds. Unsalted canned beans. Natural nut butters without added sugar and oil. Dairy Low-fat or nonfat dairy products, such as skim or 1% milk, 2% or reduced-fat cheeses, low-fat and fat-free ricotta or cottage cheese, or plain low-fat and nonfat yogurt. Fats and oils Tub margarine without trans fats. Light or reduced-fat mayonnaise and salad dressings. Avocado. Olive, canola, sesame, or safflower oils. The items listed above may not be a complete list of foods and beverages you can eat. Contact a dietitian for more information. What foods should I avoid? Fruits Canned fruit in heavy syrup. Fruit in cream or butter sauce. Fried fruit. Vegetables Vegetables cooked in cheese, cream, or butter sauce. Fried vegetables. Grains White bread. White pasta. White rice. Cornbread. Bagels, pastries, and croissants. Crackers and snack foods that contain trans fat and hydrogenated oils. Meats and other proteins Fatty cuts of meat. Ribs, chicken wings, bacon, sausage, bologna, salami, chitterlings, fatback, hot dogs, bratwurst, and packaged lunch meats. Liver and organ meats. Whole eggs and egg yolks. Chicken and [place] with skin. Fried  meat. Dairy Whole or 2% milk, cream, half-and-half, and cream cheese. Whole milk cheeses. Whole-fat or sweetened yogurt. Full-fat cheeses. Nondairy creamers and whipped toppings. Processed cheese, cheese spreads, and cheese curds. Fats and oils Butter, stick margarine, lard, shortening, ghee, or bacon fat. Coconut, palm kernel, and palm oils. Beverages Alcohol. Sugar-sweetened drinks such as sodas, lemonade, and fruit drinks. Sweets and desserts Corn syrup, sugars, honey, and molasses. Candy. Jam and jelly. Syrup. Sweetened cereals. Cookies, pies, cakes, donuts, muffins, and ice cream. The items listed above may not be a complete list of foods and beverages you should avoid. Contact a  dietitian for more information. Summary Your body needs fat and cholesterol for basic functions. However, eating too much of these things can be harmful to your health. Work with your health care provider and dietitian to follow a diet that limits fat and cholesterol. Doing this may help lower your risk for heart disease and other conditions. Choose healthy fats, such as monounsaturated and polyunsaturated fats, and foods high in omega-3 fatty acids. Eat fiber-rich foods, such as whole grains, beans, peas, fruits, and vegetables. Limit or avoid alcohol, fried foods, and foods high in saturated fats, partially hydrogenated oils, and sugar. This information is not intended to replace advice given to you by your health care provider. Make sure you discuss any questions you have with your health care provider. Document Revised: 05/12/2020 Document Reviewed: 05/12/2020 Elsevier Patient Education  2023 [employer].  Fall Prevention in the Home, Adult Falls can cause injuries and can happen to people of all ages. There are many things you can do to make your home safe and to help prevent falls. Ask for help when making these changes. What actions can I take to prevent falls? General Instructions Use good lighting in  all rooms. Replace any light bulbs that burn out. Turn on the lights in dark areas. Use night-lights. Keep items that you use often in easy-to-reach places. Lower the shelves around your home if needed. Set up your furniture so you have a clear path. Avoid moving your furniture around. Do not have throw rugs or other things on the floor that can make you trip. Avoid walking on wet floors. If any of your floors are uneven, fix them. Add color or contrast paint or tape to clearly mark and help you see: Grab bars or handrails. First and last steps of staircases. Where the edge of each step is. If you use a stepladder: Make sure that it is fully opened. Do not climb a closed stepladder. Make sure the sides of the stepladder are locked in place. Ask someone to hold the stepladder while you use it. Know where your pets are when moving through your home. What can I do in the bathroom?     Keep the floor dry. Clean up any water on the floor right away. Remove soap buildup in the tub or shower. Use nonskid mats or decals on the floor of the tub or shower. Attach bath mats securely with double-sided, nonslip rug tape. If you need to sit down in the shower, use a plastic, nonslip stool. Install grab bars by the toilet and in the tub and shower. Do not use towel bars as grab bars. What can I do in the bedroom? Make sure that you have a light by your bed that is easy to reach. Do not use any sheets or blankets for your bed that hang to the floor. Have a firm chair with side arms that you can use for support when you get dressed. What can I do in the kitchen? Clean up any spills right away. If you need to reach something above you, use a step stool with a grab bar. Keep electrical cords out of the way. Do not use floor polish or wax that makes floors slippery. What can I do with my stairs? Do not leave any items on the stairs. Make sure that you have a light switch at the top and the bottom of  the stairs. Make sure that there are handrails on both sides of the stairs. Fix handrails that are  broken or loose. Install nonslip stair treads on all your stairs. Avoid having throw rugs at the top or bottom of the stairs. Choose a carpet that does not hide the edge of the steps on the stairs. Check carpeting to make sure that it is firmly attached to the stairs. Fix carpet that is loose or worn. What can I do on the outside of my home? Use bright outdoor lighting. Fix the edges of walkways and driveways and fix any cracks. Remove anything that might make you trip as you walk through a door, such as a raised step or threshold. Trim any bushes or trees on paths to your home. Check to see if handrails are loose or broken and that both sides of all steps have handrails. Install guardrails along the edges of any raised decks and porches. Clear paths of anything that can make you trip, such as tools or rocks. Have leaves, snow, or ice cleared regularly. Use sand or salt on paths during winter. Clean up any spills in your garage right away. This includes grease or oil spills. What other actions can I take? Wear shoes that: Have a low heel. Do not wear high heels. Have rubber bottoms. Feel good on your feet and fit well. Are closed at the toe. Do not wear open-toe sandals. Use tools that help you move around if needed. These include: Canes. Walkers. Scooters. Crutches. Review your medicines with your doctor. Some medicines can make you feel dizzy. This can increase your chance of falling. Ask your doctor what else you can do to help prevent falls. Where to find more information Centers for Disease Control and Prevention, [employer]: FootballExhibition.com.br [employer] on Aging: https://walker.com/ Contact a doctor if: You are afraid of falling at home. You feel weak, drowsy, or dizzy at home. You fall at home. Summary There are many simple things that you can do to make your home safe and to help  prevent falls. Ways to make your home safe include removing things that can make you trip and installing grab bars in the bathroom. Ask for help when making these changes in your home. This information is not intended to replace advice given to you by your health care provider. Make sure you discuss any questions you have with your health care provider. Document Revised: 10/02/2020 Document Reviewed: 08/04/2019 Elsevier Patient Education  2023 [employer] Inc.  Glaucoma  Glaucoma happens when the fluid pressure in the eye is too high. If the pressure stays high for too long, the eye may get damaged. This can cause a loss of vision. There are two types of glaucoma. They are: Open-angle glaucoma. This is the most common type. It causes pressure in the eye to go up slowly. There may be no symptoms at first. Testing for this condition can help to find the condition before damage happens. Early treatment can often stop vision loss. Acute angle-closure glaucoma. With this type, the pressure inside the eye rises suddenly to a very high level. This causes very bad pain and needs to be treated right away. What are the causes? In many cases, the cause is not known. Glaucoma can sometimes result from other diseases, such as infection, cataracts, or tumors. What increases the risk? Being older than age 48. Having high blood pressure or diabetes. Having a family history of glaucoma. Having had an eye injury or eye surgery in the past. Being farsighted. Taking certain medicines. What are the signs or symptoms? Open-angle glaucoma often causes no symptoms early  on. If it is not treated, the condition: Will get worse and may cause a loss of side vision (peripheral vision). Can advance to tunnel vision, which means that you are able to see straight ahead but you have a loss of side vision in all directions. May lead to a total loss of vision. Symptoms of acute angle-closure glaucoma develop suddenly and may  include: Cloudy vision. Very bad pain in your affected eye. A very bad headache in the area around your eye. Feeling like you may vomit. Vomiting. How is this treated? Eye drops. Laser treatment. Surgery. Follow these instructions at home: Medicines Take over-the-counter and prescription medicines only as told by your doctor. If you were given eye drops, use them exactly as told. You will likely need to use this medicine for the rest of your life. General instructions Exercise often. Talk with your doctor about which types of exercise are safe for you. Do not do exercises where you lean your head on the floor while you lift part of your body off the floor, such as a headstand. Keep all follow-up visits. Contact a doctor if: Your symptoms get worse. You have new symptoms. Get help right away if: You have very bad pain in your eye. You have vision problems. You have a bad headache in the area around your eye. You feel like you may vomit (nauseous). You vomit. You start to have the same problems with your other eye. Summary Glaucoma happens when the fluid pressure in the eye is too high. If this is not treated, it can cause a loss of vision. There may be no symptoms at first. Testing can help to find the condition before damage happens. Early treatment can often stop vision loss. This information is not intended to replace advice given to you by your health care provider. Make sure you discuss any questions you have with your health care provider. Document Revised: 06/03/2019 Document Reviewed: 06/03/2019 Elsevier Patient Education  2023 [employer] Inc[employer]  Health Maintenance, Male Adopting a healthy lifestyle and getting preventive care are important in promoting health and wellness. Ask your health care provider about: The right schedule for you to have regular tests and exams. Things you can do on your own to prevent diseases and keep yourself healthy. What should I know about  diet, weight, and exercise? Eat a healthy diet  Eat a diet that includes plenty of vegetables, fruits, low-fat dairy products, and lean protein. Do not eat a lot of foods that are high in solid fats, added sugars, or sodium. Maintain a healthy weight Body mass index (BMI) is a measurement that can be used to identify possible weight problems. It estimates body fat based on height and weight. Your health care provider can help determine your BMI and help you achieve or maintain a healthy weight. Get regular exercise Get regular exercise. This is one of the most important things you can do for your health. Most adults should: Exercise for at least 150 minutes each week. The exercise should increase your heart rate and make you sweat (moderate-intensity exercise). Do strengthening exercises at least twice a week. This is in addition to the moderate-intensity exercise. Spend less time sitting. Even light physical activity can be beneficial. Watch cholesterol and blood lipids Have your blood tested for lipids and cholesterol at 66 years of age, then have this test every 5 years. You may need to have your cholesterol levels checked more often if: Your lipid or cholesterol levels are high. You  are older than 66 years of age. You are at high risk for heart disease. What should I know about cancer screening? Many types of cancers can be detected early and may often be prevented. Depending on your health history and family history, you may need to have cancer screening at various ages. This may include screening for: Colorectal cancer. Prostate cancer. Skin cancer. Lung cancer. What should I know about heart disease, diabetes, and high blood pressure? Blood pressure and heart disease High blood pressure causes heart disease and increases the risk of stroke. This is more likely to develop in people who have high blood pressure readings or are overweight. Talk with your health care provider about your  target blood pressure readings. Have your blood pressure checked: Every 3-5 years if you are 7118-66 years of age. Every year if you are 66 years old or older. If you are between the ages of 8165 and 4575 and are a current or former smoker, ask your health care provider if you should have a one-time screening for abdominal aortic aneurysm (AAA). Diabetes Have regular diabetes screenings. This checks your fasting blood sugar level. Have the screening done: Once every three years after age 66 if you are at a normal weight and have a low risk for diabetes. More often and at a younger age if you are overweight or have a high risk for diabetes. What should I know about preventing infection? Hepatitis B If you have a higher risk for hepatitis B, you should be screened for this virus. Talk with your health care provider to find out if you are at risk for hepatitis B infection. Hepatitis C Blood testing is recommended for: Everyone born from [birth year]45 through 1965. Anyone with known risk factors for hepatitis C. Sexually transmitted infections (STIs) You should be screened each year for STIs, including gonorrhea and chlamydia, if: You are sexually active and are younger than 66 years of age. You are older than 66 years of age and your health care provider tells you that you are at risk for this type of infection. Your sexual activity has changed since you were last screened, and you are at increased risk for chlamydia or gonorrhea. Ask your health care provider if you are at risk. Ask your health care provider about whether you are at high risk for HIV. Your health care provider may recommend a prescription medicine to help prevent HIV infection. If you choose to take medicine to prevent HIV, you should first get tested for HIV. You should then be tested every 3 months for as long as you are taking the medicine. Follow these instructions at home: Alcohol use Do not drink alcohol if your health care provider  tells you not to drink. If you drink alcohol: Limit how much you have to 0-2 drinks a day. Know how much alcohol is in your drink. In the [place] one drink equals one 12 oz bottle of beer (355 mL), one 5 oz glass of wine (148 mL), or one 1 oz glass of hard liquor (44 mL). Lifestyle Do not use any products that contain nicotine or tobacco. These products include cigarettes, chewing tobacco, and vaping devices, such as e-cigarettes. If you need help quitting, ask your health care provider. Do not use street drugs. Do not share needles. Ask your health care provider for help if you need support or information about quitting drugs. General instructions Schedule regular health, dental, and eye exams. Stay current with your vaccines. Tell your health care  provider if: You often feel depressed. You have ever been abused or do not feel safe at home. Summary Adopting a healthy lifestyle and getting preventive care are important in promoting health and wellness. Follow your health care provider's instructions about healthy diet, exercising, and getting tested or screened for diseases. Follow your health care provider's instructions on monitoring your cholesterol and blood pressure. This information is not intended to replace advice given to you by your health care provider. Make sure you discuss any questions you have with your health care provider. Document Revised: 05/22/2020 Document Reviewed: 05/22/2020 Elsevier Patient Education  2023 Elsevier Inc.  Hearing Loss Hearing loss is a partial or total loss of the ability to hear. This can be temporary or permanent, and it can happen in one or both ears. There are two types of hearing loss. You can have just one type or both types. You may have a problem with: Damage to your hearing nerves (sensorineural hearing loss). This type of hearing loss is more likely to be permanent. A hearing aid is often the best treatment. Sound getting to your inner ear  (conductive hearing loss). This type of hearing loss can usually be treated medically or surgically. Medical care is necessary to treat hearing loss properly and to prevent the condition from getting worse. Your hearing may partially or completely come back, depending on what caused your hearing loss and how severe it is. In some cases, hearing loss is permanent. What are the causes? Common causes of hearing loss include: Too much wax in the ear canal. Infection of the ear canal or middle ear. Fluid in the middle ear. Injury to the ear or surrounding area. An object stuck in the ear. A history of prolonged exposure to loud sounds, such as music. Less common causes of hearing loss include: Tumors in the ear. Viral or bacterial infections, such as meningitis. A hole in the eardrum (perforated eardrum). Problems with the hearing nerve that sends signals between the brain and the ear. Certain medicines. What are the signs or symptoms? Symptoms of this condition may include: Difficulty telling the difference between sounds. Difficulty following a conversation when there is background noise. Lack of response to sounds in your environment. This may be most noticeable when you do not respond to startling sounds. Needing to turn up the volume on the television, radio, or other devices. Ringing in the ears. Dizziness. How is this diagnosed? This condition is diagnosed based on: A physical exam. A hearing test (audiometry). The test will be performed by a hearing specialist (audiologist). Imaging tests, such as an MRI or CT scan. You may also be referred to an ear, nose, and throat (ENT) specialist (otolaryngologist). How is this treated? Treatment for hearing loss may include: Earwax removal. Medicines to treat or prevent infection (antibiotics). Medicines to reduce inflammation (corticosteroids). Hearing aids or assistive listening devices. Follow these instructions at home: If you were  prescribed an antibiotic medicine, take it as told by your health care provider. Do not stop taking the antibiotic even if you start to feel better. Take over-the-counter and prescription medicines only as told by your health care provider. Avoid loud noises. Use hearing protection if you are exposed to loud noises or work in a noisy environment. Return to your normal activities as told by your health care provider. Ask your health care provider what activities are safe for you. Keep all follow-up visits. This is important. Contact a health care provider if: You feel dizzy. You  develop new symptoms. You vomit or feel nauseous. You have a fever. Get help right away if: You develop sudden changes in your vision. You have severe ear pain. You have new or increased weakness. You have a severe headache. Summary Hearing loss is a decreased ability to hear sounds around you. It can be temporary or permanent. Treatment will depend on the cause of your hearing loss. It may include earwax removal, medicines, or a hearing aid. Your hearing may partially or completely come back, depending on what caused your hearing loss and how severe it is. Keep all follow-up visits. This is important. This information is not intended to replace advice given to you by your health care provider. Make sure you discuss any questions you have with your health care provider. Document Revised: 11/09/2020 Document Reviewed: 11/09/2020 Elsevier Patient Education  2023 [employer].

## 2021-06-05 NOTE — Progress Notes (Signed)
[facility] Follow up visit  Patient Identification: Tyrone Nielsen MRN:  [id number] Date of Evaluation:  06/05/2021 [facility] Chief Complaint:  establish care, depression Visit Diagnosis:    ICD-10-CM   1. Recurrent major depressive disorder, in remission (HCC)  F33.40 buPROPion (WELLBUTRIN SR) 150 MG 12 hr tablet    2. 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    Past Psychiatric History: depression, anxiety, alcohol use  Previous Psychotropic Medications: Yes   Substance Abuse History in the last 12 months:  No.  Consequences of Substance Abuse: NA  Past Medical History:  Past Medical History:  Diagnosis Date   Anxiety    Depression    Hepatitis C    History reviewed. No pertinent surgical history.  Family Psychiatric History: nephew, schizoaffective disorder  Family History:  Family History  Problem Relation Age of Onset   Hypertension Neg Hx    Intellectual disability Neg Hx     Social History:   Social History   Socioeconomic History   Marital status: Divorced    Spouse name: Not on file   Number of children: 1   Years of education: 12   Highest education level: Not on  file  Occupational History   Occupation: Retired    Comment: Contstruction  Tobacco Use   Smoking status: Former   Smokeless tobacco: Never  Building services engineerVaping Use   Vaping Use: Never used  Substance and Sexual Activity   Alcohol use: Not Currently    Comment: Prior heavy alcohol use, 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   Drug use: Not Currently   Sexual activity: Not Currently  Other Topics Concern   Not on file  Social History Narrative   Not on file   Social Determinants of Health   Financial Resource Strain: Not on file  Food Insecurity: Not on file  Transportation Needs: Not on file  Physical Activity: Not on file  Stress: Not on file  Social Connections: Not on file     Allergies:  No Known Allergies  Metabolic Disorder Labs: Lab Results  Component Value Date   HGBA1C 5.3 12/15/2019   No results found for: PROLACTIN Lab Results  Component Value Date   CHOL 149 12/21/2020   TRIG 60 12/21/2020   HDL 54 12/21/2020   CHOLHDL 2.8 12/21/2020   LDLCALC 83 12/21/2020   LDLCALC 159 (H) 12/15/2019   Lab Results  Component Value Date   TSH 2.150 12/21/2020    Therapeutic Level Labs: No results found for: LITHIUM No results found for: CBMZ No results found for: VALPROATE  Current Medications: Current Outpatient Medications  Medication Sig Dispense Refill   acetaminophen (TYLENOL) 500 MG tablet Take  500 mg by mouth every 6 (six) hours as needed for mild pain.     amLODipine (NORVASC) 10 MG tablet Take 1 tablet (10 mg total) by mouth daily. 90 tablet 1   atorvastatin (LIPITOR) 10 MG tablet TAKE 1 TABLET (10 MG TOTAL) BY MOUTH DAILY. 90 tablet 1   Lurasidone HCl 60 MG TABS Take 1 tablet (60 mg total) by mouth daily. 30 each 0   Multiple Vitamin (MULTIVITAMIN WITH MINERALS) TABS tablet Take 1 tablet by mouth daily.     buPROPion (WELLBUTRIN SR) 150 MG 12 hr tablet TAKE 1 TABLET (150 MG TOTAL) BY MOUTH 2 (TWO) TIMES DAILY. 540 each 3   busPIRone (BUSPAR) 15 MG tablet Take 1.5 tablets (22.5 mg  total) by mouth 2 (two) times daily. 270 tablet 0   No current facility-administered medications for this visit.     Psychiatric Specialty Exam: Review of Systems  Cardiovascular:  Negative for chest pain.  Neurological:  Negative for tremors.  Psychiatric/Behavioral:  Negative for agitation and dysphoric mood.    Blood pressure 116/72, pulse 60, temperature 98.1 F (36.7 C), height 5\' 8"  (1.727 m), weight 190 lb (86.2 kg), SpO2 99 %.Body mass index is 28.89 kg/m.  General Appearance: Casual  Eye Contact:  Fair  Speech:  Clear and Coherent  Volume:  Decreased  Mood:  Euthymic  Affect:  Congruent  Thought Process:  Goal Directed  Orientation:  Full (Time, Place, and Person)  Thought Content:  Rumination  Suicidal Thoughts:  No  Homicidal Thoughts:  No  Memory:  Immediate;   Fair  Judgement:  Fair  Insight:  Fair  Psychomotor Activity:  Normal  Concentration:  Concentration: Fair  Recall:  Good  Fund of Knowledge:Good  Language: Good  Akathisia:  No  Handed:    AIMS (if indicated):  no involuntary movements  Assets:  Housing Physical Health  ADL's:  Intact  Cognition: WNL  Sleep:  Fair   Screenings: GAD-7    Flowsheet Row Office Visit from 02/14/2021 in [facility] Video Visit from 07/04/2020 in [facility] Video Visit from 03/30/2020 in [facility] Office Visit from 12/15/2019 in [facility] Office Visit from 03/23/2018 in [facility]  Total GAD-7 Score 6 9 5 1 12       PHQ2-9    Flowsheet Row Office Visit from 02/14/2021 in [facility] Office Visit from 08/24/2020 in [facility] [place] Video Visit from 07/04/2020 in [facility] Video Visit from 03/30/2020 in [facility] Office Visit from 12/15/2019 in [facility]  PHQ-2 Total Score 2 1 2 1 3   PHQ-9 Total Score 3 6 11  -- 4      Flowsheet Row Office Visit from 02/27/2021 in [facility] [place] Office Visit from 11/28/2020 in [facility] [place] Office Visit from 08/24/2020 in [facility] AT [place]  C-SSRS RISK CATEGORY No Risk No Risk No Risk       Assessment and Plan: as follows  Prior documentation reviewed  MDD recurrent moderate: in  remission,diong fair mood wise, will change abilify to latuda generic, He will let know if concern or wants to go back to abilify  GAD manageable on buspar, continue  Opiod and cocaine use: remains sober,  continue relapse prevention  Spent in office face to fact time :20 min Fu 2 -97m Thresa Ross, MD 5/23/20238:51 AM

## 2021-06-05 NOTE — Telephone Encounter (Signed)
Pt called back- he spoke with pharmacy. The latuda will cost him a lot of money. He would like to stick with abilify.   Please send medication to [facility].

## 2021-06-05 NOTE — Progress Notes (Signed)
Subjective:   Tyrone Nielsen is a 66 y.o. male who presents for a Welcome to Medicare exam.   Review of Systems  Constitutional:  Negative for fever, malaise/fatigue and weight loss.  HENT: Negative.  Negative for nosebleeds.   Eyes: Negative.  Negative for blurred vision, double vision and photophobia.  Respiratory: Negative.  Negative for cough and shortness of breath.   Cardiovascular: Negative.  Negative for chest pain, palpitations and leg swelling.  Gastrointestinal: Negative.  Negative for heartburn, nausea and vomiting.  Musculoskeletal: Negative.  Negative for myalgias.  Neurological: Negative.  Negative for dizziness, focal weakness, seizures and headaches.  Psychiatric/Behavioral: Negative.  Negative for suicidal ideas.          Objective:    There were no vitals filed for this visit. There is no height or weight on file to calculate BMI.  Medications Outpatient Encounter Medications as of 06/05/2021  Medication Sig   acetaminophen (TYLENOL) 500 MG tablet Take 500 mg by mouth every 6 (six) hours as needed for mild pain.   amLODipine (NORVASC) 10 MG tablet Take 1 tablet (10 mg total) by mouth daily.   ARIPiprazole (ABILIFY) 10 MG tablet Take 1 tablet (10 mg total) by mouth daily.   atorvastatin (LIPITOR) 10 MG tablet TAKE 1 TABLET (10 MG TOTAL) BY MOUTH DAILY.   buPROPion (WELLBUTRIN SR) 150 MG 12 hr tablet TAKE 1 TABLET (150 MG TOTAL) BY MOUTH 2 (TWO) TIMES DAILY.   busPIRone (BUSPAR) 15 MG tablet Take 1.5 tablets (22.5 mg total) by mouth 2 (two) times daily.   Lurasidone HCl 60 MG TABS Take 1 tablet (60 mg total) by mouth daily.   Multiple Vitamin (MULTIVITAMIN WITH MINERALS) TABS tablet Take 1 tablet by mouth daily.   [DISCONTINUED] ARIPiprazole (ABILIFY) 10 MG tablet Take 1 tablet (10 mg total) by mouth daily. Take one a day   No facility-administered encounter medications on file as of 06/05/2021.     History: Past Medical History:  Diagnosis Date   Anxiety     Depression    Hepatitis C    History reviewed. No pertinent surgical history.  Family History  Problem Relation Age of Onset   Hypertension Neg Hx    Intellectual disability Neg Hx    Social History   Occupational History   Occupation: Retired    Comment: Contstruction  Tobacco Use   Smoking status: Former   Smokeless tobacco: Never   Tobacco comments:    Quit smoking 7-8 years. 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   Drug use: Not Currently   Sexual activity: Not Currently   Tobacco Counseling Counseling given: Not Answered Tobacco comments: Quit smoking 7-8 years. Does not vape or chew tobacco   Immunizations and Health Maintenance Immunization History  Administered Date(s) Administered   PFIZER(Purple Top)SARS-COV-2 Vaccination 01/24/2019, 02/14/2019   Pneumococcal-Unspecified 05/02/2021   Zoster Recombinat (Shingrix) 01/24/2021, 05/03/2021   Health Maintenance Due  Topic Date Due   TETANUS/TDAP  Never done   COVID-19 Vaccine (3 - Booster for Pfizer series) 04/11/2019   Pneumonia Vaccine 54+ Years old (1 - PCV) 05/09/2020    Activities of Daily Living     View : No data to display.          Physical Exam  No exam performed today(optional), or other factors deemed  appropriate based on the beneficiary's medical and social history and current clinical standards.  Advanced Directives: Tyrone Nielsen will receive forms for this at his next in person office visit.       Assessment:    This is a routine wellness  examination for this patient .   Vision/Hearing screen No results found.  Dietary issues and exercise activities discussed:  Overeating late at night. Unhealthy snacking. Walks at the park every day for an hour.      Goals   Maintain good health.      Depression Screen    06/05/2021    1:52 PM 02/14/2021     2:21 PM 08/24/2020   11:15 AM 07/04/2020    2:33 PM  PHQ 2/9 Scores  PHQ - 2 Score 4 2    PHQ- 9 Score 6 3       Information is confidential and restricted. Go to Review Flowsheets to unlock data.     Fall Risk    02/14/2021    2:21 PM  Fall Risk   Falls in the past year? 1  Number falls in past yr: 0  Injury with Fall? 0  Risk for fall due to : No Fall Risks          Patient Care Team: Claiborne Rigg, NP as PCP - General (Nurse Practitioner)     Plan:   Diagnoses and all orders for this visit:  Encounter for Medicare annual wellness exam  Colon cancer screening -     Ambulatory referral to Gastroenterology  Positive fecal immunochemical test -     Ambulatory referral to Gastroenterology     I have personally reviewed and noted the following in the patient's chart:   Medical and social history Use of alcohol, tobacco or illicit drugs  Current medications and supplements Functional ability and status Nutritional status Physical activity Advanced directives List of other physicians Hospitalizations, surgeries, and ER visits in previous 12 months Vitals Screenings to include cognitive, depression, and falls Referrals and appointments  In addition, I have reviewed and discussed with patient certain preventive protocols, quality metrics, and best practice recommendations. A written personalized care plan for preventive services as well as general preventive health recommendations were provided to patient.    Claiborne Rigg, NP 06/05/2021

## 2021-06-07 ENCOUNTER — Other Ambulatory Visit: Payer: Self-pay

## 2021-06-08 ENCOUNTER — Ambulatory Visit (AMBULATORY_SURGERY_CENTER): Payer: Self-pay

## 2021-06-08 ENCOUNTER — Other Ambulatory Visit: Payer: Self-pay

## 2021-06-08 VITALS — Ht 69.0 in | Wt 190.0 lb

## 2021-06-08 DIAGNOSIS — Z1211 Encounter for screening for malignant neoplasm of colon: Secondary | ICD-10-CM

## 2021-06-08 MED ORDER — PEG 3350-KCL-NA BICARB-NACL 420 G PO SOLR
4000.0000 mL | Freq: Once | ORAL | 0 refills | Status: AC
  Filled 2021-06-08: qty 4000, 1d supply, fill #0

## 2021-06-08 NOTE — Progress Notes (Signed)
Denies allergies to eggs or soy products. Denies complication of anesthesia or sedation. Denies use of weight loss medication. Denies use of O2.   Emmi instructions given for colonoscopy.  

## 2021-06-15 ENCOUNTER — Other Ambulatory Visit: Payer: Self-pay

## 2021-06-25 ENCOUNTER — Encounter: Payer: Medicare Other | Admitting: Gastroenterology

## 2021-07-09 ENCOUNTER — Other Ambulatory Visit: Payer: Self-pay | Admitting: Physician Assistant

## 2021-07-09 ENCOUNTER — Other Ambulatory Visit: Payer: Self-pay

## 2021-07-09 DIAGNOSIS — E782 Mixed hyperlipidemia: Secondary | ICD-10-CM

## 2021-07-09 MED ORDER — ATORVASTATIN CALCIUM 10 MG PO TABS
ORAL_TABLET | Freq: Every day | ORAL | 0 refills | Status: DC
  Filled 2021-07-09: qty 90, 90d supply, fill #0

## 2021-07-11 ENCOUNTER — Other Ambulatory Visit: Payer: Self-pay

## 2021-08-13 ENCOUNTER — Other Ambulatory Visit: Payer: Self-pay

## 2021-08-13 ENCOUNTER — Other Ambulatory Visit (HOSPITAL_COMMUNITY): Payer: Self-pay | Admitting: Psychiatry

## 2021-08-13 MED ORDER — ARIPIPRAZOLE 10 MG PO TABS
10.0000 mg | ORAL_TABLET | Freq: Every day | ORAL | 1 refills | Status: DC
  Filled 2021-08-13: qty 30, 30d supply, fill #0

## 2021-08-14 ENCOUNTER — Other Ambulatory Visit: Payer: Self-pay

## 2021-08-17 ENCOUNTER — Encounter: Payer: Self-pay | Admitting: Nurse Practitioner

## 2021-08-17 ENCOUNTER — Ambulatory Visit: Payer: Medicare Other | Attending: Nurse Practitioner | Admitting: Nurse Practitioner

## 2021-08-17 ENCOUNTER — Other Ambulatory Visit: Payer: Self-pay

## 2021-08-17 VITALS — BP 125/77 | HR 74 | Temp 97.8°F | Ht 69.0 in | Wt 185.4 lb

## 2021-08-17 DIAGNOSIS — F3340 Major depressive disorder, recurrent, in remission, unspecified: Secondary | ICD-10-CM

## 2021-08-17 DIAGNOSIS — I10 Essential (primary) hypertension: Secondary | ICD-10-CM | POA: Diagnosis not present

## 2021-08-17 DIAGNOSIS — Z0000 Encounter for general adult medical examination without abnormal findings: Secondary | ICD-10-CM

## 2021-08-17 DIAGNOSIS — E785 Hyperlipidemia, unspecified: Secondary | ICD-10-CM | POA: Diagnosis not present

## 2021-08-17 DIAGNOSIS — Z1211 Encounter for screening for malignant neoplasm of colon: Secondary | ICD-10-CM | POA: Diagnosis not present

## 2021-08-17 DIAGNOSIS — M65352 Trigger finger, left little finger: Secondary | ICD-10-CM | POA: Diagnosis not present

## 2021-08-17 MED ORDER — AMLODIPINE BESYLATE 10 MG PO TABS
10.0000 mg | ORAL_TABLET | Freq: Every day | ORAL | 1 refills | Status: DC
Start: 2021-08-17 — End: 2022-03-11
  Filled 2021-08-17: qty 90, 90d supply, fill #0
  Filled 2021-12-03: qty 90, 90d supply, fill #1

## 2021-08-17 NOTE — Progress Notes (Signed)
Assessment & Plan:  Tyrone Nielsen was seen today for annual exam.  Diagnoses and all orders for this visit:  Encounter for annual physical exam  Trigger little finger of left hand -     Ambulatory referral to Hand Surgery  Essential hypertension -     CMP14+EGFR -     amLODipine (NORVASC) 10 MG tablet; Take 1 tablet (10 mg total) by mouth daily.  Recurrent major depressive disorder, in remission (HCC) Endorses mood stability. Continue all medications as prescribed.   Dyslipidemia, goal LDL below 100 -     Lipid panel  Colon cancer screening -     Cologuard    Patient has been counseled on age-appropriate routine health concerns for screening and prevention. These are reviewed and up-to-date. Referrals have been placed accordingly. Immunizations are up-to-date or declined.    Subjective:   Chief Complaint  Patient presents with   Annual Exam   HPI Tyrone Nielsen 66 y.o. male presents to office today for annual physical exam. He has trigger finger of the left pinky. Will be referred to the hand specialist for this. Blood pressure is well controlled. Weight is down several lbs today as well. He walks for an hour every day.     Review of Systems  Constitutional:  Negative for fever, malaise/fatigue and weight loss.  HENT: Negative.  Negative for nosebleeds.   Eyes: Negative.  Negative for blurred vision, double vision and photophobia.  Respiratory: Negative.  Negative for cough and shortness of breath.   Cardiovascular: Negative.  Negative for chest pain, palpitations and leg swelling.  Gastrointestinal: Negative.  Negative for heartburn, nausea and vomiting.  Genitourinary: Negative.   Musculoskeletal: Negative.  Negative for myalgias.       SEE HPI  Skin: Negative.   Neurological: Negative.  Negative for dizziness, focal weakness, seizures and headaches.  Endo/Heme/Allergies: Negative.   Psychiatric/Behavioral: Negative.  Negative for suicidal ideas.     Past Medical  History:  Diagnosis Date   Anxiety    Depression    Hepatitis C    Hypertension    Substance abuse ([place])     Past Surgical History:  Procedure Laterality Date   APPENDECTOMY      Family History  Problem Relation Age of Onset   Hypertension Neg Hx    Intellectual disability Neg Hx    Colon cancer Neg Hx    Esophageal cancer Neg Hx    Stomach cancer Neg Hx    Rectal cancer Neg Hx     Social History Reviewed with no changes to be made today.   Outpatient Medications Prior to Visit  Medication Sig Dispense Refill   ARIPiprazole (ABILIFY) 10 MG tablet Take 1 tablet (10 mg total) by mouth daily. 30 tablet 1   atorvastatin (LIPITOR) 10 MG tablet TAKE 1 TABLET (10 MG TOTAL) BY MOUTH DAILY. 90 tablet 0   buPROPion (WELLBUTRIN SR) 150 MG 12 hr tablet TAKE 1 TABLET (150 MG TOTAL) BY MOUTH 2 (TWO) TIMES DAILY. 540 each 3   busPIRone (BUSPAR) 15 MG tablet Take 1.5 tablets (22.5 mg total) by mouth 2 (two) times daily. 270 tablet 0   Multiple Vitamin (MULTIVITAMIN WITH MINERALS) TABS tablet Take 1 tablet by mouth daily.     OVER THE COUNTER MEDICATION Cranberry, One tablet daily.     amLODipine (NORVASC) 10 MG tablet Take 1 tablet (10 mg total) by mouth daily. 90 tablet 1   acetaminophen (TYLENOL) 500 MG tablet Take 500 mg by mouth  every 6 (six) hours as needed for mild pain. (Patient not taking: Reported on 08/17/2021)     Lurasidone HCl 60 MG TABS Take 1 tablet (60 mg total) by mouth daily. (Patient not taking: Reported on 06/08/2021) 30 each 0   No facility-administered medications prior to visit.    No Known Allergies     Objective:    BP 125/77   Pulse 74   Temp 97.8 F (36.6 C) (Oral)   Ht 5' 9"  (1.753 m)   Wt 185 lb 6.4 oz (84.1 kg)   SpO2 98%   BMI 27.38 kg/m  Wt Readings from Last 3 Encounters:  08/17/21 185 lb 6.4 oz (84.1 kg)  06/08/21 190 lb (86.2 kg)  02/14/21 185 lb 2 oz (84 kg)    Physical Exam Constitutional:      Appearance: He is well-developed.  HENT:      Head: Normocephalic and atraumatic.     Right Ear: Hearing, tympanic membrane, ear canal and external ear normal.     Left Ear: Hearing, tympanic membrane, ear canal and external ear normal.     Nose: Nose normal. No mucosal edema or rhinorrhea.     Right Turbinates: Not enlarged.     Left Turbinates: Not enlarged.     Mouth/Throat:     Lips: Pink.     Mouth: Mucous membranes are moist.     Dentition: No gingival swelling, dental abscesses or gum lesions.     Pharynx: Uvula midline.     Tonsils: No tonsillar exudate. 1+ on the right. 1+ on the left.  Eyes:     General: Lids are normal. No scleral icterus.    Extraocular Movements: Extraocular movements intact.     Conjunctiva/sclera: Conjunctivae normal.     Pupils: Pupils are equal, round, and reactive to light.  Neck:     Thyroid: No thyromegaly.     Trachea: No tracheal deviation.  Cardiovascular:     Rate and Rhythm: Normal rate and regular rhythm.     Heart sounds: Normal heart sounds. No murmur heard.    No friction rub. No gallop.  Pulmonary:     Effort: Pulmonary effort is normal. No respiratory distress.     Breath sounds: Normal breath sounds. No wheezing or rales.  Chest:     Chest wall: No mass or tenderness.  Breasts:    Right: No inverted nipple, mass, nipple discharge, skin change or tenderness.     Left: No inverted nipple, mass, nipple discharge, skin change or tenderness.  Abdominal:     General: Bowel sounds are normal. There is no distension.     Palpations: Abdomen is soft. There is no mass.     Tenderness: There is no abdominal tenderness. There is no guarding or rebound.  Musculoskeletal:        General: No tenderness or deformity. Normal range of motion.     Cervical back: Normal range of motion and neck supple.  Lymphadenopathy:     Cervical: No cervical adenopathy.  Skin:    General: Skin is warm and dry.     Capillary Refill: Capillary refill takes less than 2 seconds.     Findings: No  erythema.  Neurological:     Mental Status: He is alert and oriented to person, place, and time.     Cranial Nerves: No cranial nerve deficit.     Sensory: Sensation is intact.     Motor: No abnormal muscle tone.     Coordination:  Coordination is intact. Coordination normal.     Gait: Gait is intact.     Deep Tendon Reflexes: Reflexes normal.     Reflex Scores:      Patellar reflexes are 1+ on the right side and 1+ on the left side. Psychiatric:        Attention and Perception: Attention normal.        Mood and Affect: Mood normal.        Speech: Speech normal.        Behavior: Behavior normal.        Thought Content: Thought content normal.        Judgment: Judgment normal.          Patient has been counseled extensively about nutrition and exercise as well as the importance of adherence with medications and regular follow-up. The patient was given clear instructions to go to ER or return to medical center if symptoms don't improve, worsen or new problems develop. The patient verbalized understanding.   Follow-up: Return in about 6 months (around 02/17/2022).   Gildardo Pounds, FNP-BC [facility] and [place], [place]   08/17/2021, 1:40 PM

## 2021-08-18 LAB — CMP14+EGFR
ALT: 21 IU/L (ref 0–44)
AST: 16 IU/L (ref 0–40)
Albumin/Globulin Ratio: 1.8 (ref 1.2–2.2)
Albumin: 5 g/dL — ABNORMAL HIGH (ref 3.9–4.9)
Alkaline Phosphatase: 98 IU/L (ref 44–121)
BUN/Creatinine Ratio: 9 — ABNORMAL LOW (ref 10–24)
BUN: 8 mg/dL (ref 8–27)
Bilirubin Total: 0.6 mg/dL (ref 0.0–1.2)
CO2: 22 mmol/L (ref 20–29)
Calcium: 10 mg/dL (ref 8.6–10.2)
Chloride: 91 mmol/L — ABNORMAL LOW (ref 96–106)
Creatinine, Ser: 0.91 mg/dL (ref 0.76–1.27)
Globulin, Total: 2.8 g/dL (ref 1.5–4.5)
Glucose: 98 mg/dL (ref 70–99)
Potassium: 4.5 mmol/L (ref 3.5–5.2)
Sodium: 131 mmol/L — ABNORMAL LOW (ref 134–144)
Total Protein: 7.8 g/dL (ref 6.0–8.5)
eGFR: 93 mL/min/{1.73_m2} (ref >59)

## 2021-08-18 LAB — LIPID PANEL
Chol/HDL Ratio: 3.1 ratio (ref 0.0–5.0)
Cholesterol, Total: 156 mg/dL (ref 100–199)
HDL: 51 mg/dL (ref >39)
LDL Chol Calc (NIH): 81 mg/dL (ref 0–99)
Triglycerides: 134 mg/dL (ref 0–149)
VLDL Cholesterol Cal: 24 mg/dL (ref 5–40)

## 2021-08-19 ENCOUNTER — Other Ambulatory Visit: Payer: Self-pay | Admitting: Nurse Practitioner

## 2021-08-19 DIAGNOSIS — E871 Hypo-osmolality and hyponatremia: Secondary | ICD-10-CM

## 2021-08-31 ENCOUNTER — Other Ambulatory Visit: Payer: Self-pay

## 2021-08-31 ENCOUNTER — Encounter (HOSPITAL_COMMUNITY): Payer: Self-pay | Admitting: Psychiatry

## 2021-08-31 ENCOUNTER — Telehealth (INDEPENDENT_AMBULATORY_CARE_PROVIDER_SITE_OTHER): Payer: Medicare Other | Admitting: Psychiatry

## 2021-08-31 DIAGNOSIS — F1121 Opioid dependence, in remission: Secondary | ICD-10-CM | POA: Diagnosis not present

## 2021-08-31 DIAGNOSIS — F411 Generalized anxiety disorder: Secondary | ICD-10-CM | POA: Diagnosis not present

## 2021-08-31 DIAGNOSIS — F3340 Major depressive disorder, recurrent, in remission, unspecified: Secondary | ICD-10-CM

## 2021-08-31 MED ORDER — BUSPIRONE HCL 15 MG PO TABS
22.5000 mg | ORAL_TABLET | Freq: Two times a day (BID) | ORAL | 0 refills | Status: DC
  Filled 2021-08-31: qty 270, 90d supply, fill #0

## 2021-08-31 MED ORDER — BUPROPION HCL ER (SR) 150 MG PO TB12
150.0000 mg | ORAL_TABLET | Freq: Two times a day (BID) | ORAL | 0 refills | Status: DC
  Filled 2021-08-31: qty 180, 90d supply, fill #0

## 2021-08-31 MED ORDER — ARIPIPRAZOLE 10 MG PO TABS
10.0000 mg | ORAL_TABLET | Freq: Every day | ORAL | 1 refills | Status: DC
  Filled 2021-08-31 – 2021-09-11 (×2): qty 30, 30d supply, fill #0
  Filled 2021-10-08: qty 30, 30d supply, fill #1

## 2021-08-31 NOTE — Progress Notes (Signed)
[facility] Follow up visit  Patient Identification: Tyrone Nielsen MRN:  [id number] Date of Evaluation:  08/31/2021 Referral Source: [facility] Chief Complaint:  follow up,  depression Visit Diagnosis:    ICD-10-CM   1. Recurrent major depressive disorder, in remission (HCC)  F33.40 buPROPion (WELLBUTRIN SR) 150 MG 12 hr tablet    2. Generalized anxiety disorder  F41.1 busPIRone (BUSPAR) 15 MG tablet    3. Opioid dependence in remission [facility])  F11.21      Virtual Visit via Video Note  I connected with [name] on 08/31/21 at 10:00 AM EDT by a video enabled telemedicine application and verified that I am speaking with the correct person using two identifiers.  Location: Patient: home Provider: home office   I discussed the limitations of evaluation and management by telemedicine and the availability of in person appointments. The patient expressed understanding and agreed to proceed.      I discussed the assessment and treatment plan with the patient. The patient was provided an opportunity to ask questions and all were answered. The patient agreed with the plan and demonstrated an understanding of the instructions.   The patient was advised to call back or seek an in-person evaluation if the symptoms worsen or if the condition fails to improve as anticipated.  I provided 15 minutes of non-face-to-face time during this encounter.   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    Past Psychiatric History: depression, anxiety, alcohol use  Previous Psychotropic Medications: Yes   Substance Abuse History in the last 12 months:  No.  Consequences of Substance Abuse: NA  Past Medical History:  Past Medical History:  Diagnosis Date   Anxiety    Depression    Hepatitis C    Hypertension    Substance abuse (HCC)     Past Surgical History:  Procedure Laterality Date   APPENDECTOMY      Family Psychiatric History: nephew, schizoaffective disorder  Family History:  Family History  Problem Relation Age of Onset   Hypertension Neg Hx    Intellectual disability Neg Hx    Colon cancer Neg Hx    Esophageal cancer Neg Hx    Stomach cancer Neg Hx    Rectal cancer Neg Hx     Social History:   Social History   Socioeconomic History   Marital status: Divorced    Spouse name: Not on file   Number of children: 1   Years of education: 12   Highest education level: Not on file  Occupational History   Occupation: Retired    Comment: Contstruction  Tobacco Use   Smoking status: Former   Smokeless tobacco: Never   Tobacco comments:    Quit smoking 7-8 years. Does not vape or chew tobacco  Vaping Use   Vaping Use: Never used  Substance  and Sexual Activity   Alcohol use: Not Currently    Comment: Prior heavy alcohol use, 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   Drug use: Not Currently   Sexual activity: Not Currently  Other Topics Concern   Not on file  Social History Narrative   Not on file   Social Determinants of Health   Financial Resource Strain: Low Risk  (06/05/2021)   Overall Financial Resource Strain (CARDIA)    Difficulty of Paying Living Expenses: Not hard at all  Food Insecurity: No Food Insecurity (06/05/2021)   Hunger Vital Sign    Worried About Running Out of Food in the Last Year: Never true    Ran  Out of Food in the Last Year: Never true  Transportation Needs: No Transportation Needs (06/05/2021)   PRAPARE - Administrator, Civil Service (Medical): No    Lack of Transportation (Non-Medical): No  Physical Activity: Sufficiently Active (06/05/2021)   Exercise Vital Sign    Days of Exercise per Week: 5 days    Minutes of Exercise per Session: 60 min  Stress: Stress Concern Present (06/05/2021)   [employer] of Occupational Health - Occupational Stress Questionnaire    Feeling of Stress : Rather much  Social Connections: Unknown (06/05/2021)   Social Connection and Isolation Panel [NHANES]    Frequency of Communication with Friends and Family: Three times a week    Frequency of Social Gatherings with Friends and Family: Twice a week    Attends Religious Services: More than 4 times per year    Active Member of Clubs or Organizations: Yes    Attends Engineer, structural: More than 4 times per year    Marital Status: Not on file     Allergies:  No Known Allergies  Metabolic Disorder Labs: Lab Results  Component Value Date   HGBA1C 5.3 12/15/2019   No results found for: "PROLACTIN" Lab Results  Component Value Date   CHOL 156 08/17/2021   TRIG 134 08/17/2021   HDL 51 08/17/2021   CHOLHDL 3.1 08/17/2021   LDLCALC 81 08/17/2021   LDLCALC 83 12/21/2020   Lab Results  Component Value Date   TSH 2.150 12/21/2020    Therapeutic Level Labs: No results found for: "LITHIUM" No results found for: "CBMZ" No results found for: "VALPROATE"  Current Medications: Current Outpatient Medications  Medication Sig Dispense Refill   acetaminophen (TYLENOL) 500 MG tablet Take 500 mg by mouth every 6 (six) hours as needed for mild pain. (Patient not taking: Reported on 08/17/2021)     amLODipine (NORVASC) 10 MG tablet Take 1 tablet (10 mg total) by mouth daily. 90 tablet 1   ARIPiprazole (ABILIFY) 10 MG tablet Take 1 tablet (10 mg total) by mouth daily. 30 tablet 1    atorvastatin (LIPITOR) 10 MG tablet TAKE 1 TABLET (10 MG TOTAL) BY MOUTH DAILY. 90 tablet 0   buPROPion (WELLBUTRIN SR) 150 MG 12 hr tablet Take 1 tablet (150 mg total) by mouth 2 (two) times daily. 180 tablet 0   busPIRone (BUSPAR) 15 MG tablet Take 1.5 tablets (22.5 mg total) by mouth 2 (two) times daily. 270 tablet 0   Multiple Vitamin (MULTIVITAMIN WITH MINERALS) TABS tablet Take 1 tablet by mouth daily.     OVER THE COUNTER MEDICATION Cranberry, One tablet daily.     No current facility-administered medications for this visit.     Psychiatric Specialty Exam: Review of Systems  Cardiovascular:  Negative for chest pain.  Neurological:  Negative  for tremors.  Psychiatric/Behavioral:  Negative for agitation and dysphoric mood.     There were no vitals taken for this visit.There is no height or weight on file to calculate BMI.  General Appearance: Casual  Eye Contact:  Fair  Speech:  Clear and Coherent  Volume:  Decreased  Mood:  Euthymic  Affect:  Congruent  Thought Process:  Goal Directed  Orientation:  Full (Time, Place, and Person)  Thought Content:  Rumination  Suicidal Thoughts:  No  Homicidal Thoughts:  No  Memory:  Immediate;   Fair  Judgement:  Fair  Insight:  Fair  Psychomotor Activity:  Normal  Concentration:  Concentration: Fair  Recall:  Good  Fund of Knowledge:Good  Language: Good  Akathisia:  No  Handed:    AIMS (if indicated):  no involuntary movements  Assets:  Architect Housing Physical Health  ADL's:  Intact  Cognition: WNL  Sleep:  Fair   Screenings: GAD-7    Garment/textile technologist Visit from 08/17/2021 in [facility] Office Visit from 02/14/2021 in [facility] Video Visit from 07/04/2020 in [facility] Video Visit from 03/30/2020 in [facility] Office Visit from 12/15/2019 in [facility]  Total GAD-7 Score 0 6 9 5 1       PHQ2-9    Flowsheet Row Office Visit from 08/17/2021 in [facility] Office Visit from 06/05/2021 in [facility] Office Visit from 02/14/2021 in [facility] Office Visit from 08/24/2020 in [facility] [place] Video Visit from 07/04/2020 in [facility]  PHQ-2 Total Score 2 4 2 1 2   PHQ-9 Total Score 3 6 3 6 11       Flowsheet Row Video Visit from 08/31/2021 in [facility] AT [place] Office Visit from 06/05/2021 in [facility] AT [place] Office Visit from 02/27/2021 in [facility] AT [place]  C-SSRS RISK CATEGORY No Risk No Risk No Risk       Assessment and Plan: as follows  Prior documentation reviewed  MDD recurrent moderate: in  remission: doing fair continue abilify, no side effects Continue wellbutrin   GAD manageable continue buspar   Opiod and cocaine use: remains sober, continue relapse prevention  Fu 4 -44m.  06/07/2021, MD 8/18/202310:24 AM

## 2021-09-03 ENCOUNTER — Other Ambulatory Visit: Payer: Self-pay

## 2021-09-05 ENCOUNTER — Other Ambulatory Visit: Payer: Self-pay | Admitting: Nurse Practitioner

## 2021-09-05 DIAGNOSIS — Z1211 Encounter for screening for malignant neoplasm of colon: Secondary | ICD-10-CM

## 2021-09-05 LAB — COLOGUARD

## 2021-09-11 ENCOUNTER — Encounter: Payer: Self-pay | Admitting: Sports Medicine

## 2021-09-11 ENCOUNTER — Ambulatory Visit: Payer: Medicare Other | Admitting: Sports Medicine

## 2021-09-11 ENCOUNTER — Other Ambulatory Visit: Payer: Self-pay

## 2021-09-11 DIAGNOSIS — M720 Palmar fascial fibromatosis [Dupuytren]: Secondary | ICD-10-CM

## 2021-09-11 NOTE — Progress Notes (Signed)
Office Visit Note   Patient: Tyrone Nielsen           Date of Birth: May 11, 1955           MRN: [id number] Visit Date: 09/11/2021              Requested by: Claiborne Rigg, NP [address] [place],  [place] [address] PCP: Claiborne Rigg, NP   Assessment & Plan: Visit Diagnoses:  1. Dupuytren's contracture of left hand    Plan: I had a discussion with Tyrone Nielsen regarding his Dupuytren's contracture, unfortunately his fifth finger contracture is rather severe involving at least 90 degrees of flexion contracture both at the MCP and the PIP joint.  He does have mild Dupuytren's contracture with flexion at the MCP joint only of the fourth finger.  We discussed all treatment options such as injection therapy, collagenase, aponeurotomy, as well as surgical release.  Discussed at this point given the severity of his contracture, I feel that surgery would be the best option for him.  We will have him meet with Dr. Roda Shutters to discuss possible surgical correction.  Encouraged him to continue working on full straightening and extension of the fourth year in the meantime.  Follow-Up Instructions: Return for follow-up with Dr. Roda Shutters for Duputryen's contracture (5th and 4th).   Orders:  No orders of the defined types were placed in this encounter.  No orders of the defined types were placed in this encounter.   Subjective: Chief Complaint  Patient presents with   Left Little Finger - Pain    HPI Mr. Tyrone Nielsen Tyrone Nielsen) is a pleasant 66 year old male who presents for left pinky and ring pain and contracture.  He states that his left pinky finger has had a contracture and it for almost 20 years, although recently has progressed and gotten worse.  His fourth finger is now starting to do a bit of the same.  He recently saw his primary care physician on 08/17/2021, I independently reviewed that note, they evaluated this as a triggering episode and had him see orthopedics, so he came to see me today.  The  patient denies any bothersome pain, but notes that it is harder and harder to do his daily activities given the flexion contracture of the fingers.  Has never had any injection or surgery for this.  He works part-time doing work with his hands and has to adjust and grip things differently because of the contracture.  He does not take any medication on a consistent basis for this.  He denies any history of diabetes, no coronary artery disease.  Denies being diagnosed with osteoporosis/penia.   Objective: Vital Signs:  - HR: 85 - RR: 13  Physical Exam Gen: Well-appearing, in no acute distress; non-toxic CV: Regular Rate. Well-perfused. Warm.  Resp: Breathing unlabored on room air; no wheezing. Psych: Fluid speech in conversation; appropriate affect; normal thought process Neuro: Sensation intact throughout. No gross coordination deficits.   Ortho Exam - Left hand: There is a severe Dupuytren's contracture of the left fifth pinky finger with at least 90 degrees of flexion contracture both at the MCP and the PIP.  I am unable to straighten either joint passively.  There is also a mild contracture of the left ring finger with a small flexion contracture of about 15 degrees at the MCP joint, no contracture involving the PIP of this fourth finger.  Notable aponeurotic thickening of the Dupuytren's contracture in the palm.  No overlying  redness or erythema.  PMFS History: Patient Active Problem List   Diagnosis Date Noted   Essential hypertension 12/19/2020   Elevated blood pressure reading with diagnosis of hypertension 12/19/2020   Mixed hyperlipidemia 12/19/2020   Dupuytren's contracture of left hand 12/24/2019   Recurrent major depressive disorder, in remission (HCC) 10/28/2019   MDD (major depressive disorder) 07/29/2019   Generalized anxiety disorder 07/29/2019   Opioid dependence (HCC) 07/29/2019   Acute alcoholism (HCC) 07/29/2019   Cocaine dependence, continuous (HCC) 07/29/2019    Chronic hepatitis C without hepatic coma (HCC) 05/06/2018   Past Medical History:  Diagnosis Date   Anxiety    Depression    Hepatitis C    Hypertension    Substance abuse (HCC)     Family History  Problem Relation Age of Onset   Hypertension Neg Hx    Intellectual disability Neg Hx    Colon cancer Neg Hx    Esophageal cancer Neg Hx    Stomach cancer Neg Hx    Rectal cancer Neg Hx     Past Surgical History:  Procedure Laterality Date   APPENDECTOMY     Social History   Occupational History   Occupation: Retired    Comment: Contstruction  Tobacco Use   Smoking status: Former   Smokeless tobacco: Never   Tobacco comments:    Quit smoking 7-8 years. 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   Drug use: Not Currently   Sexual activity: Not Currently

## 2021-09-11 NOTE — Progress Notes (Signed)
Left little finger is triggering for years, now to the point that he can't open finger.

## 2021-09-14 ENCOUNTER — Ambulatory Visit: Payer: Medicare Other | Admitting: Orthopaedic Surgery

## 2021-09-14 DIAGNOSIS — Z1211 Encounter for screening for malignant neoplasm of colon: Secondary | ICD-10-CM | POA: Diagnosis not present

## 2021-09-21 LAB — COLOGUARD: COLOGUARD: NEGATIVE

## 2021-10-08 ENCOUNTER — Other Ambulatory Visit: Payer: Self-pay

## 2021-10-08 ENCOUNTER — Other Ambulatory Visit: Payer: Self-pay | Admitting: Nurse Practitioner

## 2021-10-08 DIAGNOSIS — E782 Mixed hyperlipidemia: Secondary | ICD-10-CM

## 2021-10-09 ENCOUNTER — Other Ambulatory Visit: Payer: Self-pay

## 2021-10-09 MED ORDER — ATORVASTATIN CALCIUM 10 MG PO TABS
10.0000 mg | ORAL_TABLET | Freq: Every day | ORAL | 3 refills | Status: DC
  Filled 2021-10-09: qty 90, 90d supply, fill #0
  Filled 2022-01-15: qty 90, 90d supply, fill #1
  Filled 2022-04-15: qty 90, 90d supply, fill #2
  Filled 2022-07-11: qty 90, 90d supply, fill #3

## 2021-10-09 NOTE — Telephone Encounter (Signed)
Requested Prescriptions  Pending Prescriptions Disp Refills  . atorvastatin (LIPITOR) 10 MG tablet 90 tablet 3    Sig: TAKE 1 TABLET (10 MG TOTAL) BY MOUTH DAILY.     Cardiovascular:  Antilipid - Statins Failed - 10/09/2021 11:41 AM      Failed - Lipid Panel in normal range within the last 12 months    Cholesterol, Total  Date Value Ref Range Status  08/17/2021 156 100 - 199 mg/dL Final   LDL Chol Calc (NIH)  Date Value Ref Range Status  08/17/2021 81 0 - 99 mg/dL Final   HDL  Date Value Ref Range Status  08/17/2021 51 >39 mg/dL Final   Triglycerides  Date Value Ref Range Status  08/17/2021 134 0 - 149 mg/dL Final         Passed - Patient is not pregnant      Passed - Valid encounter within last 12 months    Recent Outpatient Visits          1 month ago Encounter for annual physical exam   [place], Vernia Buff, NP   4 months ago 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

## 2021-10-10 ENCOUNTER — Other Ambulatory Visit: Payer: Self-pay

## 2021-11-07 ENCOUNTER — Other Ambulatory Visit: Payer: Self-pay

## 2021-11-12 ENCOUNTER — Other Ambulatory Visit (HOSPITAL_COMMUNITY): Payer: Self-pay | Admitting: Psychiatry

## 2021-11-12 ENCOUNTER — Other Ambulatory Visit: Payer: Self-pay

## 2021-11-12 MED ORDER — ARIPIPRAZOLE 10 MG PO TABS
10.0000 mg | ORAL_TABLET | Freq: Every day | ORAL | 1 refills | Status: DC
  Filled 2021-11-12: qty 30, 30d supply, fill #0
  Filled 2021-12-11 – 2022-01-15 (×2): qty 30, 30d supply, fill #1

## 2021-12-03 ENCOUNTER — Other Ambulatory Visit (HOSPITAL_COMMUNITY): Payer: Self-pay | Admitting: Psychiatry

## 2021-12-03 ENCOUNTER — Other Ambulatory Visit: Payer: Self-pay

## 2021-12-03 DIAGNOSIS — F411 Generalized anxiety disorder: Secondary | ICD-10-CM

## 2021-12-03 DIAGNOSIS — F3340 Major depressive disorder, recurrent, in remission, unspecified: Secondary | ICD-10-CM

## 2021-12-03 MED ORDER — BUPROPION HCL ER (SR) 150 MG PO TB12
150.0000 mg | ORAL_TABLET | Freq: Two times a day (BID) | ORAL | 0 refills | Status: DC
  Filled 2021-12-03: qty 180, 90d supply, fill #0

## 2021-12-03 MED ORDER — BUSPIRONE HCL 15 MG PO TABS
22.5000 mg | ORAL_TABLET | Freq: Two times a day (BID) | ORAL | 0 refills | Status: DC
  Filled 2021-12-03: qty 30, 10d supply, fill #0
  Filled 2021-12-04: qty 240, 80d supply, fill #0

## 2021-12-04 ENCOUNTER — Other Ambulatory Visit: Payer: Self-pay

## 2021-12-05 ENCOUNTER — Other Ambulatory Visit: Payer: Self-pay

## 2021-12-11 ENCOUNTER — Other Ambulatory Visit: Payer: Self-pay

## 2021-12-18 ENCOUNTER — Other Ambulatory Visit: Payer: Self-pay

## 2022-01-15 ENCOUNTER — Other Ambulatory Visit: Payer: Self-pay

## 2022-01-16 ENCOUNTER — Other Ambulatory Visit: Payer: Self-pay

## 2022-02-11 ENCOUNTER — Other Ambulatory Visit (HOSPITAL_COMMUNITY): Payer: Self-pay | Admitting: Psychiatry

## 2022-02-11 ENCOUNTER — Other Ambulatory Visit: Payer: Self-pay

## 2022-02-12 ENCOUNTER — Other Ambulatory Visit (HOSPITAL_COMMUNITY): Payer: Self-pay | Admitting: Nurse Practitioner

## 2022-02-12 ENCOUNTER — Other Ambulatory Visit: Payer: Self-pay

## 2022-02-12 ENCOUNTER — Telehealth (HOSPITAL_COMMUNITY): Payer: Self-pay | Admitting: *Deleted

## 2022-02-12 MED ORDER — ARIPIPRAZOLE 10 MG PO TABS
10.0000 mg | ORAL_TABLET | Freq: Every day | ORAL | 1 refills | Status: DC
  Filled 2022-02-12: qty 30, 30d supply, fill #0

## 2022-02-12 NOTE — Telephone Encounter (Signed)
Requested medications are due for refill today.  Provider to determine  Requested medications are on the active medications list.  yes  Last refill. 11/12/2021 #30 1 rf  Future visit scheduled.   yes  Notes to clinic.  Rx signed by Merian Capron    Requested Prescriptions  Pending Prescriptions Disp Refills   ARIPiprazole (ABILIFY) 10 MG tablet 30 tablet 1    Sig: Take 1 tablet (10 mg total) by mouth daily.     There is no refill protocol information for this order

## 2022-02-12 NOTE — Telephone Encounter (Signed)
[place]  Refill request  ARIPiprazole (ABILIFY) 10 MG tablet   Next appt  03/01/22 Last appt  08/31/21

## 2022-02-20 ENCOUNTER — Encounter: Payer: Self-pay | Admitting: Nurse Practitioner

## 2022-02-20 ENCOUNTER — Ambulatory Visit: Payer: Medicare Other | Attending: Nurse Practitioner | Admitting: Nurse Practitioner

## 2022-02-20 VITALS — BP 127/77 | HR 65 | Ht 69.0 in | Wt 185.8 lb

## 2022-02-20 DIAGNOSIS — E785 Hyperlipidemia, unspecified: Secondary | ICD-10-CM

## 2022-02-20 DIAGNOSIS — I10 Essential (primary) hypertension: Secondary | ICD-10-CM | POA: Diagnosis not present

## 2022-02-20 DIAGNOSIS — F1121 Opioid dependence, in remission: Secondary | ICD-10-CM

## 2022-02-20 DIAGNOSIS — R7989 Other specified abnormal findings of blood chemistry: Secondary | ICD-10-CM | POA: Diagnosis not present

## 2022-02-20 DIAGNOSIS — B182 Chronic viral hepatitis C: Secondary | ICD-10-CM

## 2022-02-20 DIAGNOSIS — F3340 Major depressive disorder, recurrent, in remission, unspecified: Secondary | ICD-10-CM

## 2022-02-20 NOTE — Progress Notes (Signed)
Assessment & Plan:  [name] was seen today for hypertension.  Diagnoses and all orders for this visit:  Primary hypertension -     CMP14+EGFR Continue all antihypertensives as prescribed.  Reminded to bring in blood pressure log for follow  up appointment.  RECOMMENDATIONS: DASH/Mediterranean Diets are healthier choices for HTN.    Dyslipidemia, goal LDL below 100 -     Lipid panel INSTRUCTIONS: Work on a low fat, heart healthy diet and participate in regular aerobic exercise program by working out at least 150 minutes per week; 5 days a week-30 minutes per day. Avoid red meat/beef/steak,  fried foods. junk foods, sodas, sugary drinks, unhealthy snacking, alcohol and smoking.  Drink at least 80 oz of water per day and monitor your carbohydrate intake daily.    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 These are reviewed and up-to-date. Referrals have been placed accordingly. Immunizations are up-to-date or declined.    Subjective:   Chief Complaint  Patient presents with   Hypertension   HPI Tyrone Nielsen 67 y.o. male presents to office today for follow up to HTN  He has a past medical history of Anxiety, Depression, ETOH abuse and Hepatitis C.   Blood pressure is well controlled. Weight is stable.  He walks for an hour every day.  He is taking pain 10 mg daily as prescribed. BP Readings from Last 3 Encounters:  02/20/22 127/77  08/17/21 125/77  02/14/21 (!) 137/92     Review of Systems  Constitutional:  Negative for fever, malaise/fatigue and weight loss.  HENT: Negative.  Negative for nosebleeds.   Eyes: Negative.  Negative for blurred vision, double  vision and photophobia.  Respiratory: Negative.  Negative for cough and shortness of breath.   Cardiovascular: Negative.  Negative for chest pain, palpitations and leg swelling.  Gastrointestinal: Negative.  Negative for heartburn, nausea and vomiting.  Musculoskeletal: Negative.  Negative for myalgias.  Neurological: Negative.  Negative for dizziness, focal weakness, seizures and headaches.  Psychiatric/Behavioral: Negative.  Negative for suicidal ideas.     Past Medical History:  Diagnosis Date   Anxiety    Depression    Hepatitis C    Hypertension    Substance abuse ([place])     Past Surgical History:  Procedure Laterality Date   APPENDECTOMY      Family History  Problem Relation Age of Onset   Hypertension Neg Hx    Intellectual disability Neg Hx    Colon cancer Neg Hx    Esophageal cancer Neg Hx    Stomach cancer Neg Hx    Rectal cancer Neg Hx     Social History Reviewed with no changes to be made today.   Outpatient Medications Prior to Visit  Medication Sig Dispense Refill   amLODipine (NORVASC) 10 MG tablet Take 1 tablet (10 mg total) by mouth daily. 90 tablet 1   ARIPiprazole (ABILIFY) 10 MG tablet Take 1 tablet (10 mg total) by mouth daily. 30 tablet 1   atorvastatin (LIPITOR) 10 MG tablet Take 1 tablet (10 mg total) by mouth daily. 90 tablet 3   buPROPion (WELLBUTRIN SR) 150 MG 12 hr tablet Take 1 tablet (150 mg total) by mouth  2 (two) times daily. 180 tablet 0   busPIRone (BUSPAR) 15 MG tablet Take 1.5 tablets (22.5 mg total) by mouth 2 (two) times daily. 270 tablet 0   Multiple Vitamin (MULTIVITAMIN WITH MINERALS) TABS tablet Take 1 tablet by mouth daily.     acetaminophen (TYLENOL) 500 MG tablet Take 500 mg by mouth every 6 (six) hours as needed for mild pain. (Patient not taking: Reported on 08/17/2021)     OVER THE COUNTER MEDICATION Cranberry, One tablet daily.     No facility-administered medications prior to visit.    No Known Allergies     Objective:     BP 127/77   Pulse 65   Ht 5\' 9"  (1.753 m)   Wt 185 lb 12.8 oz (84.3 kg)   SpO2 98%   BMI 27.44 kg/m  Wt Readings from Last 3 Encounters:  02/20/22 185 lb 12.8 oz (84.3 kg)  08/17/21 185 lb 6.4 oz (84.1 kg)  06/08/21 190 lb (86.2 kg)    Physical Exam Vitals and nursing note reviewed.  Constitutional:      Appearance: He is well-developed.  HENT:     Head: Normocephalic and atraumatic.  Cardiovascular:     Rate and Rhythm: Normal rate and regular rhythm.     Heart sounds: Normal heart sounds. No murmur heard.    No friction rub. No gallop.  Pulmonary:     Effort: Pulmonary effort is normal. No tachypnea or respiratory distress.     Breath sounds: Normal breath sounds. No decreased breath sounds, wheezing, rhonchi or rales.  Chest:     Chest wall: No tenderness.  Abdominal:     General: Bowel sounds are normal.     Palpations: Abdomen is soft.  Musculoskeletal:        General: Normal range of motion.     Cervical back: Normal range of motion.  Skin:    General: Skin is warm and dry.  Neurological:     Mental Status: He is alert and oriented to person, place, and time.     Coordination: Coordination normal.  Psychiatric:        Behavior: Behavior normal. Behavior is cooperative.        Thought Content: Thought content normal.        Judgment: Judgment normal.          Patient has been counseled extensively about nutrition and exercise as well as the importance of adherence with medications and regular follow-up. The patient was given clear instructions to go to ER or return to medical center if symptoms don't improve, worsen or new problems develop. The patient verbalized understanding.   Follow-up: Return in about 3 months (around 05/21/2022).   Gildardo Pounds, FNP-BC [facility] [place], [place]   02/20/2022, 6:26 PM

## 2022-02-21 LAB — CMP14+EGFR
ALT: 30 IU/L (ref 0–44)
AST: 24 IU/L (ref 0–40)
Albumin/Globulin Ratio: 2 (ref 1.2–2.2)
Albumin: 4.8 g/dL (ref 3.9–4.9)
Alkaline Phosphatase: 99 IU/L (ref 44–121)
BUN/Creatinine Ratio: 14 (ref 10–24)
BUN: 13 mg/dL (ref 8–27)
Bilirubin Total: 0.4 mg/dL (ref 0.0–1.2)
CO2: 23 mmol/L (ref 20–29)
Calcium: 9.5 mg/dL (ref 8.6–10.2)
Chloride: 98 mmol/L (ref 96–106)
Creatinine, Ser: 0.95 mg/dL (ref 0.76–1.27)
Globulin, Total: 2.4 g/dL (ref 1.5–4.5)
Glucose: 87 mg/dL (ref 70–99)
Potassium: 4.7 mmol/L (ref 3.5–5.2)
Sodium: 136 mmol/L (ref 134–144)
Total Protein: 7.2 g/dL (ref 6.0–8.5)
eGFR: 88 mL/min/{1.73_m2} (ref >59)

## 2022-02-21 LAB — CBC WITH DIFFERENTIAL/PLATELET
Basophils Absolute: 0.1 10*3/uL (ref 0.0–0.2)
Basos: 1 %
EOS (ABSOLUTE): 0.1 10*3/uL (ref 0.0–0.4)
Eos: 2 %
Hematocrit: 46.6 % (ref 37.5–51.0)
Hemoglobin: 15.9 g/dL (ref 13.0–17.7)
Immature Grans (Abs): 0 10*3/uL (ref 0.0–0.1)
Immature Granulocytes: 0 %
Lymphocytes Absolute: 1.5 10*3/uL (ref 0.7–3.1)
Lymphs: 19 %
MCH: 31.2 pg (ref 26.6–33.0)
MCHC: 34.1 g/dL (ref 31.5–35.7)
MCV: 91 fL (ref 79–97)
Monocytes Absolute: 0.7 10*3/uL (ref 0.1–0.9)
Monocytes: 9 %
Neutrophils Absolute: 5.7 10*3/uL (ref 1.4–7.0)
Neutrophils: 69 %
Platelets: 321 10*3/uL (ref 150–450)
RBC: 5.1 x10E6/uL (ref 4.14–5.80)
RDW: 12 % (ref 11.6–15.4)
WBC: 8.2 10*3/uL (ref 3.4–10.8)

## 2022-02-21 LAB — LIPID PANEL
Chol/HDL Ratio: 3.2 ratio (ref 0.0–5.0)
Cholesterol, Total: 158 mg/dL (ref 100–199)
HDL: 50 mg/dL (ref >39)
LDL Chol Calc (NIH): 88 mg/dL (ref 0–99)
Triglycerides: 113 mg/dL (ref 0–149)
VLDL Cholesterol Cal: 20 mg/dL (ref 5–40)

## 2022-03-01 ENCOUNTER — Telehealth (INDEPENDENT_AMBULATORY_CARE_PROVIDER_SITE_OTHER): Payer: Medicare Other | Admitting: Psychiatry

## 2022-03-01 ENCOUNTER — Encounter (HOSPITAL_COMMUNITY): Payer: Self-pay | Admitting: Psychiatry

## 2022-03-01 ENCOUNTER — Other Ambulatory Visit: Payer: Self-pay

## 2022-03-01 DIAGNOSIS — F411 Generalized anxiety disorder: Secondary | ICD-10-CM | POA: Diagnosis not present

## 2022-03-01 DIAGNOSIS — F1121 Opioid dependence, in remission: Secondary | ICD-10-CM

## 2022-03-01 DIAGNOSIS — F3340 Major depressive disorder, recurrent, in remission, unspecified: Secondary | ICD-10-CM | POA: Diagnosis not present

## 2022-03-01 MED ORDER — ARIPIPRAZOLE 10 MG PO TABS
10.0000 mg | ORAL_TABLET | Freq: Every day | ORAL | 1 refills | Status: DC
  Filled 2022-03-01 – 2022-03-11 (×2): qty 30, 30d supply, fill #0
  Filled 2022-04-15: qty 30, 30d supply, fill #1

## 2022-03-01 MED ORDER — BUSPIRONE HCL 15 MG PO TABS
22.5000 mg | ORAL_TABLET | Freq: Two times a day (BID) | ORAL | 0 refills | Status: DC
  Filled 2022-03-01: qty 270, 90d supply, fill #0

## 2022-03-01 MED ORDER — BUPROPION HCL ER (SR) 150 MG PO TB12
150.0000 mg | ORAL_TABLET | Freq: Two times a day (BID) | ORAL | 0 refills | Status: DC
  Filled 2022-03-01: qty 180, 90d supply, fill #0

## 2022-03-01 NOTE — Progress Notes (Signed)
[place] Follow up visit  Patient Identification: Tyrone Nielsen MRN:  [id number] Date of Evaluation:  03/01/2022 Referral Source: [facility] Chief Complaint:  follow up,  depression Visit Diagnosis:    ICD-10-CM   1. Recurrent major depressive disorder, in remission (HCC)  F33.40 buPROPion (WELLBUTRIN SR) 150 MG 12 hr tablet    2. Generalized anxiety disorder  F41.1 busPIRone (BUSPAR) 15 MG tablet    3. Opioid dependence in remission [facility])  F11.21     Virtual Visit via Video Note  I connected with Tyrone Nielsen on 03/01/22 at 10:30 AM EST by a video enabled telemedicine application and verified that I am speaking with the correct person using two identifiers.  Location: Patient: home Provider: home office   I discussed the limitations of evaluation and management by telemedicine and the availability of in person appointments. The patient expressed understanding and agreed to proceed.        I discussed the assessment and treatment plan with the patient. The patient was provided an opportunity to ask questions and all were answered. The patient agreed with the plan and demonstrated an understanding of the instructions.   The patient was advised to call back or seek an in-person evaluation if the symptoms worsen or if the condition fails to improve as anticipated.  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    Past Psychiatric History: depression, anxiety, alcohol use  Previous Psychotropic Medications: Yes   Substance Abuse History in the last 12 months:  No.  Consequences of Substance Abuse: NA  Past Medical History:  Past Medical History:  Diagnosis Date   Anxiety    Depression    Hepatitis C    Hypertension    Substance abuse ([place])     Past Surgical History:  Procedure Laterality Date   APPENDECTOMY      Family Psychiatric History: nephew, schizoaffective disorder  Family History:  Family History  Problem Relation Age of Onset   Hypertension Neg Hx    Intellectual disability Neg Hx    Colon cancer Neg Hx    Esophageal cancer Neg Hx    Stomach cancer Neg Hx    Rectal cancer Neg Hx     Social History:   Social History   Socioeconomic History   Marital status: Divorced    Spouse name: Not on file   Number of children: 1   Years of education: 12   Highest education level: Not on file  Occupational History   Occupation: Retired    Comment: Contstruction  Tobacco Use   Smoking status: Former   Smokeless tobacco: Never   Tobacco comments:    Quit smoking 7-8 years. 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   Drug use: Not Currently   Sexual activity: Not Currently  Other Topics Concern   Not on file  Social History Narrative   Not on file   Social Determinants of Health   Financial Resource Strain: Low Risk  (06/05/2021)   Overall Financial Resource Strain (CARDIA)    Difficulty of Paying Living Expenses: Not hard at all  Food Insecurity: No Food Insecurity (06/05/2021)   Hunger Vital Sign    Worried About Running Out of Food in the Last Year:  Never true    Ran Out of Food in the Last Year: Never true  Transportation Needs: No Transportation Needs (06/05/2021)   PRAPARE - Hydrologist (Medical): No    Lack of Transportation (Non-Medical): No  Physical Activity: Sufficiently Active (06/05/2021)   Exercise Vital Sign    Days of Exercise per Week: 5 days    Minutes of Exercise per Session: 60 min  Stress: Stress Concern Present (06/05/2021)   [place]    Feeling of Stress : Rather much  Social Connections: Unknown (06/05/2021)   Social Connection and Isolation Panel [NHANES]    Frequency of Communication with Friends and Family: Three times a week    Frequency of Social Gatherings with Friends and Family: Twice a week    Attends Religious Services: More than 4 times per year    Active Member of Clubs or Organizations: Yes    Attends Music therapist: More than 4 times per year    Marital Status: Not on file     Allergies:  No Known Allergies  Metabolic Disorder Labs: Lab Results  Component Value Date   HGBA1C 5.3 12/15/2019   No results found for: "PROLACTIN" Lab Results  Component Value Date   CHOL 158 02/20/2022   TRIG 113 02/20/2022   HDL 50 02/20/2022   CHOLHDL 3.2 02/20/2022   [place] 88 02/20/2022   Benjamin Perez 81 08/17/2021   Lab Results  Component Value Date   TSH 2.150 12/21/2020    Therapeutic Level Labs: No results found for: "LITHIUM" No results found for: "CBMZ" No results found for: "VALPROATE"  Current Medications: Current Outpatient Medications  Medication Sig Dispense Refill   acetaminophen (TYLENOL) 500 MG tablet Take 500 mg by mouth every 6 (six) hours as needed for mild pain. (Patient not taking: Reported on 08/17/2021)     amLODipine (NORVASC) 10 MG tablet Take 1 tablet (10 mg total) by mouth daily. 90 tablet 1   ARIPiprazole (ABILIFY) 10 MG tablet Take 1 tablet (10 mg total) by mouth  daily. 30 tablet 1   atorvastatin (LIPITOR) 10 MG tablet Take 1 tablet (10 mg total) by mouth daily. 90 tablet 3   buPROPion (WELLBUTRIN SR) 150 MG 12 hr tablet Take 1 tablet (150 mg total) by mouth 2 (two) times daily. 180 tablet 0   busPIRone (BUSPAR) 15 MG tablet Take 1.5 tablets (22.5 mg total) by mouth 2 (two) times daily. 270 tablet 0   Multiple Vitamin (MULTIVITAMIN WITH MINERALS) TABS tablet Take 1 tablet by mouth daily.     OVER THE COUNTER MEDICATION Cranberry, One tablet daily.     No current facility-administered medications for this visit.     Psychiatric Specialty Exam: Review of Systems  Cardiovascular:  Negative for chest pain.  Neurological:  Negative for tremors.  Psychiatric/Behavioral:  Negative for agitation and dysphoric mood.     There were no vitals taken for this visit.There is no height or weight on file to calculate BMI.  General Appearance: Casual  Eye Contact:  Fair  Speech:  Clear and Coherent  Volume:  Decreased  Mood:  Euthymic  Affect:  Congruent  Thought Process:  Goal Directed  Orientation:  Full (Time, Place, and Person)  Thought Content:  Rumination  Suicidal Thoughts:  No  Homicidal Thoughts:  No  Memory:  Immediate;   Fair  Judgement:  Fair  Insight:  Fair  Psychomotor Activity:  Normal  Concentration:  Concentration: Fair  Recall:  Good  Fund of Knowledge:Good  Language: Good  Akathisia:  No  Handed:    AIMS (if indicated):  no involuntary movements  Assets:  Agricultural consultant Housing Physical Health  ADL's:  Intact  Cognition: WNL  Sleep:  Fair   Screenings: GAD-7    Personnel officer Visit from 02/20/2022 in [place] Office Visit from 08/17/2021 in [place] Office Visit from 02/14/2021 in [place] Video Visit from 07/04/2020 in [facility] Video Visit from  03/30/2020 in [facility]  Total GAD-7 Score 6 0 6 9 5      $ PHQ2-9    [place] Office Visit from 02/20/2022 in [place] Office Visit from 08/17/2021 in [place] Office Visit from 06/05/2021 in [place] Office Visit from 02/14/2021 in [place] Office Visit from 08/24/2020 in [place] at [facility]  PHQ-2 Total Score 1 2 4 2 1  $ PHQ-9 Total Score 3 3 6 3 6      $ Flowsheet Row Video Visit from 08/31/2021 in [place] at [place] Visit from 06/05/2021 in [place] at [place] Visit from 02/27/2021 in [place] at [place] No Risk No Risk No Risk       Assessment and Plan: as follows  Prior documentation reviewed  MDD recurrent moderate: in  remission: doing stable, continue abilify, no tremors  Continue wellbugrin   GAD fair on buspar   Opiod and cocaine use: remains sober, continue relapse prevention  Fu 4 m.  Merian Capron, MD 2/16/202410:33 AM

## 2022-03-04 ENCOUNTER — Other Ambulatory Visit: Payer: Self-pay

## 2022-03-06 ENCOUNTER — Other Ambulatory Visit: Payer: Self-pay

## 2022-03-11 ENCOUNTER — Other Ambulatory Visit: Payer: Self-pay

## 2022-03-11 ENCOUNTER — Other Ambulatory Visit: Payer: Self-pay | Admitting: Nurse Practitioner

## 2022-03-11 DIAGNOSIS — I10 Essential (primary) hypertension: Secondary | ICD-10-CM

## 2022-03-12 ENCOUNTER — Other Ambulatory Visit: Payer: Self-pay

## 2022-03-12 MED ORDER — AMLODIPINE BESYLATE 10 MG PO TABS
10.0000 mg | ORAL_TABLET | Freq: Every day | ORAL | 0 refills | Status: DC
  Filled 2022-03-12: qty 90, 90d supply, fill #0

## 2022-03-12 NOTE — Telephone Encounter (Signed)
Requested Prescriptions  Pending Prescriptions Disp Refills   amLODipine (NORVASC) 10 MG tablet 90 tablet 0    Sig: Take 1 tablet (10 mg total) by mouth daily.     Cardiovascular: Calcium Channel Blockers 2 Passed - 03/11/2022  9:15 AM      Passed - Last BP in normal range    BP Readings from Last 1 Encounters:  02/20/22 127/77         Passed - Last Heart Rate in normal range    Pulse Readings from Last 1 Encounters:  02/20/22 65         Passed - Valid encounter within last 6 months    Recent Outpatient Visits           2 weeks ago Primary hypertension   [place], NP   6 months ago Encounter for annual physical exam   [facility] [place], Vernia Buff, NP   9 months ago Encounter for [employer] annual wellness exam   [place], Vernia Buff, NP   1 year ago Essential hypertension   [place], Vernia Buff, NP   1 year ago Essential hypertension   [place], RPH-CPP       Future Appointments             In 1 month Erlinda Hong, Marylynn Pearson, MD [place]   In 2 months Gildardo Pounds, NP [place]

## 2022-04-15 ENCOUNTER — Other Ambulatory Visit: Payer: Self-pay

## 2022-04-17 ENCOUNTER — Ambulatory Visit: Payer: Medicare Other | Admitting: Orthopaedic Surgery

## 2022-04-17 ENCOUNTER — Other Ambulatory Visit: Payer: Self-pay

## 2022-04-18 ENCOUNTER — Ambulatory Visit: Payer: Medicare Other | Admitting: Orthopaedic Surgery

## 2022-05-13 ENCOUNTER — Other Ambulatory Visit (HOSPITAL_COMMUNITY): Payer: Self-pay | Admitting: Psychiatry

## 2022-05-13 ENCOUNTER — Other Ambulatory Visit: Payer: Self-pay

## 2022-05-13 MED ORDER — ARIPIPRAZOLE 10 MG PO TABS
10.0000 mg | ORAL_TABLET | Freq: Every day | ORAL | 1 refills | Status: DC
  Filled 2022-05-13: qty 30, 30d supply, fill #0
  Filled 2022-06-11: qty 30, 30d supply, fill #1

## 2022-05-22 ENCOUNTER — Ambulatory Visit: Payer: Medicare Other | Admitting: Nurse Practitioner

## 2022-05-30 ENCOUNTER — Other Ambulatory Visit (HOSPITAL_COMMUNITY): Payer: Self-pay | Admitting: Psychiatry

## 2022-05-30 ENCOUNTER — Other Ambulatory Visit: Payer: Self-pay

## 2022-05-30 DIAGNOSIS — F3340 Major depressive disorder, recurrent, in remission, unspecified: Secondary | ICD-10-CM

## 2022-05-30 DIAGNOSIS — F411 Generalized anxiety disorder: Secondary | ICD-10-CM

## 2022-05-30 MED ORDER — BUSPIRONE HCL 15 MG PO TABS
22.5000 mg | ORAL_TABLET | Freq: Two times a day (BID) | ORAL | 0 refills | Status: DC
  Filled 2022-05-30: qty 270, 90d supply, fill #0

## 2022-05-30 MED ORDER — BUPROPION HCL ER (SR) 150 MG PO TB12
150.0000 mg | ORAL_TABLET | Freq: Two times a day (BID) | ORAL | 0 refills | Status: DC
  Filled 2022-05-30: qty 180, 90d supply, fill #0

## 2022-06-05 ENCOUNTER — Other Ambulatory Visit: Payer: Self-pay

## 2022-06-10 NOTE — Progress Notes (Unsigned)
Subjective:   Tyrone Nielsen is a 67 y.o. male who presents for an Initial Medicare Annual Wellness Visit.  Review of Systems    ***       Objective:    There were no vitals filed for this visit. There is no height or weight on file to calculate BMI.      No data to display          Current Medications (verified) Outpatient Encounter Medications as of 06/11/2022  Medication Sig   acetaminophen (TYLENOL) 500 MG tablet Take 500 mg by mouth every 6 (six) hours as needed for mild pain. (Patient not taking: Reported on 08/17/2021)   amLODipine (NORVASC) 10 MG tablet Take 1 tablet (10 mg total) by mouth daily.   ARIPiprazole (ABILIFY) 10 MG tablet Take 1 tablet (10 mg total) by mouth daily.   atorvastatin (LIPITOR) 10 MG tablet Take 1 tablet (10 mg total) by mouth daily.   buPROPion (WELLBUTRIN SR) 150 MG 12 hr tablet Take 1 tablet (150 mg total) by mouth 2 (two) times daily.   busPIRone (BUSPAR) 15 MG tablet Take 1.5 tablets (22.5 mg total) by mouth 2 (two) times daily.   Multiple Vitamin (MULTIVITAMIN WITH MINERALS) TABS tablet Take 1 tablet by mouth daily.   OVER THE COUNTER MEDICATION Cranberry, One tablet daily.   No facility-administered encounter medications on file as of 06/11/2022.    Allergies (verified) Patient has no known allergies.   History: Past Medical History:  Diagnosis Date   Anxiety    Depression    Hepatitis C    Hypertension    Substance abuse (HCC)    Past Surgical History:  Procedure Laterality Date   APPENDECTOMY     Family History  Problem Relation Age of Onset   Hypertension Neg Hx    Intellectual disability Neg Hx    Colon cancer Neg Hx    Esophageal cancer Neg Hx    Stomach cancer Neg Hx    Rectal cancer Neg Hx    Social History   Socioeconomic History   Marital status: Divorced    Spouse name: Not on file   Number of children: 1   Years of education: 12   Highest education level: Not on file  Occupational History    Occupation: Retired    Comment: Contstruction  Tobacco Use   Smoking status: Former   Smokeless tobacco: Never   Tobacco comments:    Quit smoking 7-8 years. 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   Drug use: Not Currently   Sexual activity: Not Currently  Other Topics Concern   Not on file  Social History Narrative   Not on file   Social Determinants of Health   Financial Resource Strain: Low Risk  (06/05/2021)   Overall Financial Resource Strain (CARDIA)    Difficulty of Paying Living Expenses: Not hard at all  Food Insecurity: No Food Insecurity (06/05/2021)   Hunger Vital Sign    Worried About Running Out of Food in the Last Year: Never true    Ran Out of Food in the Last Year: Never true  Transportation Needs: No Transportation Needs (06/05/2021)   PRAPARE - Administrator, Civil Service (Medical): No    Lack of Transportation (Non-Medical): No  Physical Activity: Sufficiently Active (06/05/2021)  Exercise Vital Sign    Days of Exercise per Week: 5 days    Minutes of Exercise per Session: 60 min  Stress: Stress Concern Present (06/05/2021)   [employer] of Occupational Health - Occupational Stress Questionnaire    Feeling of Stress : Rather much  Social Connections: Unknown (06/05/2021)   Social Connection and Isolation Panel [NHANES]    Frequency of Communication with Friends and Family: Three times a week    Frequency of Social Gatherings with Friends and Family: Twice a week    Attends Religious Services: More than 4 times per year    Active Member of [employer] or Organizations: Yes    Attends Engineer, structural: More than 4 times per year    Marital Status: Not on file    Tobacco Counseling Counseling given: Not Answered Tobacco comments: Quit smoking 7-8 years. Does not vape or chew  tobacco   Clinical Intake:                 Diabetic?No          Activities of Daily Living     No data to display          Patient Care Team: Claiborne Rigg, NP as PCP - General (Nurse Practitioner)  Indicate any recent Medical Services you may have received from other than [facility] providers in the past year (date may be approximate).     Assessment:   This is a routine wellness examination for Tyrone Nielsen.  Hearing/Vision screen No results found.  Dietary issues and exercise activities discussed:     Goals Addressed   None    Depression Screen    02/20/2022   11:25 AM 08/17/2021   10:20 AM 06/05/2021    1:52 PM 02/14/2021    2:21 PM 08/24/2020   11:15 AM 07/04/2020    2:33 PM 03/30/2020   11:08 AM  PHQ 2/9 Scores  PHQ - 2 Score 1 2 4 2      PHQ- 9 Score 3 3 6 3         Information is confidential and restricted. Go to Review Flowsheets to unlock data.    Fall Risk    02/20/2022   11:11 AM 08/17/2021   10:16 AM 02/14/2021    2:21 PM 05/06/2018    2:47 PM  Fall Risk   Falls in the past year? 0 0 1 0  Number falls in past yr: 0 0 0   Injury with Fall? 0 0 0   Risk for fall due to : No Fall Risks  No Fall Risks   Follow up Falls evaluation completed       FALL RISK PREVENTION PERTAINING TO THE HOME:  Any stairs in or around the home? {YES/NO:21197} If so, are there any without handrails? {YES/NO:21197} Home free of loose throw rugs in walkways, pet beds, electrical cords, etc? {YES/NO:21197} Adequate lighting in your home to reduce risk of falls? {YES/NO:21197}  ASSISTIVE DEVICES UTILIZED TO PREVENT FALLS:  Life alert? {YES/NO:21197} Use of a cane, walker or w/c? {YES/NO:21197} Grab bars in the bathroom? {YES/NO:21197} Shower chair or bench in shower? {YES/NO:21197} Elevated toilet seat or a handicapped toilet? {YES/NO:21197}  TIMED UP AND GO:  Was the test performed? No . Telephonic visit   Cognitive Function:         Immunizations Immunization History  Administered Date(s) Administered   PFIZER(Purple Top)SARS-COV-2 Vaccination 01/24/2019, 02/14/2019   Pneumococcal-Unspecified 05/02/2021   Zoster Recombinat (Shingrix) 06/13/2017, 01/24/2021, 05/03/2021  TDAP status: Due, Education has been provided regarding the importance of this vaccine. Advised may receive this vaccine at local pharmacy or Health Dept. Aware to provide a copy of the vaccination record if obtained from local pharmacy or Health Dept. Verbalized acceptance and understanding.  Pneumococcal vaccine status: Due, Education has been provided regarding the importance of this vaccine. Advised may receive this vaccine at local pharmacy or Health Dept. Aware to provide a copy of the vaccination record if obtained from local pharmacy or Health Dept. Verbalized acceptance and understanding.  Covid-19 vaccine status: Information provided on how to obtain vaccines.   Qualifies for Shingles Vaccine? Yes   Zostavax completed No   Shingrix Completed?: Yes  Screening Tests Health Maintenance  Topic Date Due   COVID-19 Vaccine (3 - 2023-24 season) 09/14/2021   Medicare Annual Wellness (AWV)  06/06/2022   Pneumonia Vaccine 83+ Years old (1 of 1 - PCV) 08/18/2022 (Originally 05/09/2020)   INFLUENZA VACCINE  08/15/2022   Hepatitis C Screening  Completed   Zoster Vaccines- Shingrix  Completed   HPV VACCINES  Aged Out   DTaP/Tdap/Td  Discontinued   Colonoscopy  Discontinued    Health Maintenance  Health Maintenance Due  Topic Date Due   COVID-19 Vaccine (3 - 2023-24 season) 09/14/2021   Medicare Annual Wellness (AWV)  06/06/2022    Colorectal cancer screening: Type of screening: Cologuard. Completed 09/14/21. Repeat every 3 years  Lung Cancer Screening: (Low Dose CT Chest recommended if Age 29-80 years, 30 pack-year currently smoking OR have quit w/in 15years.) does not qualify.   Lung Cancer Screening Referral: n/a  Additional  Screening:  Hepatitis C Screening: does qualify; Completed 02/20/22  Vision Screening: Recommended annual ophthalmology exams for early detection of glaucoma and other disorders of the eye. Is the patient up to date with their annual eye exam?  {YES/NO:21197} Who is the provider or what is the name of the office in which the patient attends annual eye exams? *** If pt is not established with a provider, would they like to be referred to a provider to establish care? {YES/NO:21197}.   Dental Screening: Recommended annual dental exams for proper oral hygiene  Community Resource Referral / Chronic Care Management: CRR required this visit?  {YES/NO:21197}  CCM required this visit?  {YES/NO:21197}     Plan:     I have personally reviewed and noted the following in the patient's chart:   Medical and social history Use of alcohol, tobacco or illicit drugs  Current medications and supplements including opioid prescriptions. {Opioid [phone]} Functional ability and status Nutritional status Physical activity Advanced directives List of other physicians Hospitalizations, surgeries, and ER visits in previous 12 months Vitals Screenings to include cognitive, depression, and falls Referrals and appointments  In addition, I have reviewed and discussed with patient certain preventive protocols, quality metrics, and best practice recommendations. A written personalized care plan for preventive services as well as general preventive health recommendations were provided to patient.     Durwin Nora, [place]   1/61/0960   Due to this being a virtual visit, the after visit summary with patients personalized plan was offered to patient via mail or my-chart. ***Patient declined at this time./ Patient would like to access on my-chart/ per request, patient was mailed a copy of AVS./ Patient preferred to pick up at office at next visit  Nurse Notes: ***

## 2022-06-10 NOTE — Patient Instructions (Signed)
Tyrone Nielsen , Thank you for taking time to come for your Medicare Wellness Visit. I appreciate your ongoing commitment to your health goals. Please review the following plan we discussed and let me know if I can assist you in the future.   These are the goals we discussed:  Goals   None     This is a list of the screening recommended for you and due dates:  Health Maintenance  Topic Date Due   COVID-19 Vaccine (3 - 2023-24 season) 09/14/2021   Medicare Annual Wellness Visit  06/06/2022   Pneumonia Vaccine (1 of 1 - PCV) 08/18/2022*   Flu Shot  08/15/2022   Hepatitis C Screening  Completed   Zoster (Shingles) Vaccine  Completed   HPV Vaccine  Aged Out   DTaP/Tdap/Td vaccine  Discontinued   Colon Cancer Screening  Discontinued  *Topic was postponed. The date shown is not the original due date.    Advanced directives: Information on Advanced Care Planning can be found at [facility] Advance Health Care Directives Advance Health Care Directives ([email or web address]  Please bring a copy of your health care power of attorney and living will to the office to be added to your chart at your convenience.   Conditions/risks identified: Aim for 30 minutes of exercise or brisk walking, 6-8 glasses of water, and 5 servings of fruits and vegetables each day.   Next appointment: Follow up in one year for your annual wellness visit.   Preventive Care 28 Years and Older, Male  Preventive care refers to lifestyle choices and visits with your health care provider that can promote health and wellness. What does preventive care include? A yearly physical exam. This is also called an annual well check. Dental exams once or twice a year. Routine eye exams. Ask your health care provider how often you should have your eyes checked. Personal lifestyle choices, including: Daily care of your teeth and gums. Regular physical activity. Eating a healthy diet. Avoiding tobacco and drug  use. Limiting alcohol use. Practicing safe sex. Taking low doses of aspirin every day. Taking vitamin and mineral supplements as recommended by your health care provider. What happens during an annual well check? The services and screenings done by your health care provider during your annual well check will depend on your age, overall health, lifestyle risk factors, and family history of disease. Counseling  Your health care provider may ask you questions about your: Alcohol use. Tobacco use. Drug use. Emotional well-being. Home and relationship well-being. Sexual activity. Eating habits. History of falls. Memory and ability to understand (cognition). Work and work Astronomer. Screening  You may have the following tests or measurements: Height, weight, and BMI. Blood pressure. Lipid and cholesterol levels. These may be checked every 5 years, or more frequently if you are over 82 years old. Skin check. Lung cancer screening. You may have this screening every year starting at age 60 if you have a 30-pack-year history of smoking and currently smoke or have quit within the past 15 years. Fecal occult blood test (FOBT) of the stool. You may have this test every year starting at age 52. Flexible sigmoidoscopy or colonoscopy. You may have a sigmoidoscopy every 5 years or a colonoscopy every 10 years starting at age 66. Prostate cancer screening. Recommendations will vary depending on your family history and other risks. Hepatitis C blood test. Hepatitis B blood test. Sexually transmitted disease (STD) testing. Diabetes screening. This is done by checking your blood  sugar (glucose) after you have not eaten for a while (fasting). You may have this done every 1-3 years. Abdominal aortic aneurysm (AAA) screening. You may need this if you are a current or former smoker. Osteoporosis. You may be screened starting at age 11 if you are at high risk. Talk with your health care provider about  your test results, treatment options, and if necessary, the need for more tests. Vaccines  Your health care provider may recommend certain vaccines, such as: Influenza vaccine. This is recommended every year. Tetanus, diphtheria, and acellular pertussis (Tdap, Td) vaccine. You may need a Td booster every 10 years. Zoster vaccine. You may need this after age 76. Pneumococcal 13-valent conjugate (PCV13) vaccine. One dose is recommended after age 32. Pneumococcal polysaccharide (PPSV23) vaccine. One dose is recommended after age 79. Talk to your health care provider about which screenings and vaccines you need and how often you need them. This information is not intended to replace advice given to you by your health care provider. Make sure you discuss any questions you have with your health care provider. Document Released: 01/27/2015 Document Revised: 09/20/2015 Document Reviewed: 11/01/2014 Elsevier Interactive Patient Education  2017 [employer].  Fall Prevention in the Home Falls can cause injuries. They can happen to people of all ages. There are many things you can do to make your home safe and to help prevent falls. What can I do on the outside of my home? Regularly fix the edges of walkways and driveways and fix any cracks. Remove anything that might make you trip as you walk through a door, such as a raised step or threshold. Trim any bushes or trees on the path to your home. Use bright outdoor lighting. Clear any walking paths of anything that might make someone trip, such as rocks or tools. Regularly check to see if handrails are loose or broken. Make sure that both sides of any steps have handrails. Any raised decks and porches should have guardrails on the edges. Have any leaves, snow, or ice cleared regularly. Use sand or salt on walking paths during winter. Clean up any spills in your garage right away. This includes oil or grease spills. What can I do in the bathroom? Use  night lights. Install grab bars by the toilet and in the tub and shower. Do not use towel bars as grab bars. Use non-skid mats or decals in the tub or shower. If you need to sit down in the shower, use a plastic, non-slip stool. Keep the floor dry. Clean up any water that spills on the floor as soon as it happens. Remove soap buildup in the tub or shower regularly. Attach bath mats securely with double-sided non-slip rug tape. Do not have throw rugs and other things on the floor that can make you trip. What can I do in the bedroom? Use night lights. Make sure that you have a light by your bed that is easy to reach. Do not use any sheets or blankets that are too big for your bed. They should not hang down onto the floor. Have a firm chair that has side arms. You can use this for support while you get dressed. Do not have throw rugs and other things on the floor that can make you trip. What can I do in the kitchen? Clean up any spills right away. Avoid walking on wet floors. Keep items that you use a lot in easy-to-reach places. If you need to reach something above you,  use a strong step stool that has a grab bar. Keep electrical cords out of the way. Do not use floor polish or wax that makes floors slippery. If you must use wax, use non-skid floor wax. Do not have throw rugs and other things on the floor that can make you trip. What can I do with my stairs? Do not leave any items on the stairs. Make sure that there are handrails on both sides of the stairs and use them. Fix handrails that are broken or loose. Make sure that handrails are as long as the stairways. Check any carpeting to make sure that it is firmly attached to the stairs. Fix any carpet that is loose or worn. Avoid having throw rugs at the top or bottom of the stairs. If you do have throw rugs, attach them to the floor with carpet tape. Make sure that you have a light switch at the top of the stairs and the bottom of the  stairs. If you do not have them, ask someone to add them for you. What else can I do to help prevent falls? Wear shoes that: Do not have high heels. Have rubber bottoms. Are comfortable and fit you well. Are closed at the toe. Do not wear sandals. If you use a stepladder: Make sure that it is fully opened. Do not climb a closed stepladder. Make sure that both sides of the stepladder are locked into place. Ask someone to hold it for you, if possible. Clearly mark and make sure that you can see: Any grab bars or handrails. First and last steps. Where the edge of each step is. Use tools that help you move around (mobility aids) if they are needed. These include: Canes. Walkers. Scooters. Crutches. Turn on the lights when you go into a dark area. Replace any light bulbs as soon as they burn out. Set up your furniture so you have a clear path. Avoid moving your furniture around. If any of your floors are uneven, fix them. If there are any pets around you, be aware of where they are. Review your medicines with your doctor. Some medicines can make you feel dizzy. This can increase your chance of falling. Ask your doctor what other things that you can do to help prevent falls. This information is not intended to replace advice given to you by your health care provider. Make sure you discuss any questions you have with your health care provider. Document Released: 10/27/2008 Document Revised: 06/08/2015 Document Reviewed: 02/04/2014 Elsevier Interactive Patient Education  2017 [employer].

## 2022-06-11 ENCOUNTER — Other Ambulatory Visit: Payer: Self-pay

## 2022-06-11 ENCOUNTER — Other Ambulatory Visit: Payer: Self-pay | Admitting: Nurse Practitioner

## 2022-06-11 ENCOUNTER — Ambulatory Visit: Payer: Medicare Other | Attending: Nurse Practitioner

## 2022-06-11 VITALS — Ht 69.0 in | Wt 185.0 lb

## 2022-06-11 DIAGNOSIS — Z0000 Encounter for general adult medical examination without abnormal findings: Secondary | ICD-10-CM

## 2022-06-11 DIAGNOSIS — I10 Essential (primary) hypertension: Secondary | ICD-10-CM

## 2022-06-12 ENCOUNTER — Other Ambulatory Visit: Payer: Self-pay

## 2022-06-12 MED ORDER — AMLODIPINE BESYLATE 10 MG PO TABS
10.0000 mg | ORAL_TABLET | Freq: Every day | ORAL | 0 refills | Status: DC
  Filled 2022-06-12: qty 90, 90d supply, fill #0

## 2022-06-28 ENCOUNTER — Telehealth (HOSPITAL_COMMUNITY): Payer: Medicare Other | Admitting: Psychiatry

## 2022-07-05 ENCOUNTER — Telehealth (INDEPENDENT_AMBULATORY_CARE_PROVIDER_SITE_OTHER): Payer: Medicare Other | Admitting: Psychiatry

## 2022-07-05 ENCOUNTER — Encounter (HOSPITAL_COMMUNITY): Payer: Self-pay | Admitting: Psychiatry

## 2022-07-05 ENCOUNTER — Other Ambulatory Visit: Payer: Self-pay

## 2022-07-05 DIAGNOSIS — F411 Generalized anxiety disorder: Secondary | ICD-10-CM | POA: Diagnosis not present

## 2022-07-05 DIAGNOSIS — F3340 Major depressive disorder, recurrent, in remission, unspecified: Secondary | ICD-10-CM

## 2022-07-05 DIAGNOSIS — F1121 Opioid dependence, in remission: Secondary | ICD-10-CM | POA: Diagnosis not present

## 2022-07-05 MED ORDER — BUSPIRONE HCL 15 MG PO TABS
15.0000 mg | ORAL_TABLET | Freq: Three times a day (TID) | ORAL | 0 refills | Status: DC
  Filled 2022-07-05 – 2022-08-07 (×2): qty 90, 30d supply, fill #0

## 2022-07-05 MED ORDER — ARIPIPRAZOLE 10 MG PO TABS
10.0000 mg | ORAL_TABLET | Freq: Every day | ORAL | 1 refills | Status: DC
  Filled 2022-07-05: qty 30, 30d supply, fill #0
  Filled 2022-08-07: qty 30, 30d supply, fill #1

## 2022-07-05 NOTE — Progress Notes (Signed)
[facility] Follow up visit  Patient Identification: Tyrone Nielsen MRN:  [id number] Date of Evaluation:  07/05/2022 Referral Source: [facility] Chief Complaint:  follow up,  anxiety  Visit Diagnosis:    ICD-10-CM   1. Recurrent major depressive disorder, in remission (HCC)  F33.40     2. Generalized anxiety disorder  F41.1 busPIRone (BUSPAR) 15 MG tablet    3. Opioid dependence in remission [facility])  F11.21      Virtual Visit via Video Note  I connected with Tyrone Nielsen on 07/05/22 at  8:30 AM EDT by a video enabled telemedicine application and verified that I am speaking with the correct person using two identifiers.  Location: Patient: home Provider: home office   I discussed the limitations of evaluation and management by telemedicine and the availability of in person appointments. The patient expressed understanding and agreed to proceed.      I discussed the assessment and treatment plan with the patient. The patient was provided an opportunity to ask questions and all were answered. The patient agreed with the plan and demonstrated an understanding of the instructions.   The patient was advised to call back or seek an in-person evaluation if the symptoms worsen or if the condition fails to improve as anticipated.  I provided 15 minutes of non-face-to-face time during this encounter.        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    Past Psychiatric History: depression, anxiety, alcohol use  Previous Psychotropic Medications: Yes   Substance Abuse History in the last 12 months:  No.  Consequences of Substance Abuse: NA  Past Medical History:  Past Medical History:  Diagnosis Date   Anxiety    Depression    Hepatitis C    Hypertension    Substance abuse (HCC)     Past Surgical History:  Procedure Laterality Date   APPENDECTOMY      Family Psychiatric History: nephew, schizoaffective disorder  Family History:  Family History  Problem Relation Age of Onset   Hypertension Neg Hx    Intellectual disability Neg Hx    Colon cancer Neg Hx    Esophageal cancer Neg Hx    Stomach cancer Neg Hx    Rectal cancer Neg Hx     Social History:   Social History   Socioeconomic History   Marital status: Divorced    Spouse name: Not on file   Number of children: 1   Years of education: 12   Highest education level: Not on file  Occupational History   Occupation: Retired    Comment: Contstruction  Tobacco Use   Smoking status: Former   Smokeless tobacco: Never   Tobacco comments:    Quit smoking 7-8 years. Does not vape or chew tobacco  Vaping Use   Vaping Use: Never used  Substance and Sexual Activity   Alcohol use: Not Currently    Comment: Prior heavy  alcohol use, 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   Drug use: Not Currently   Sexual activity: Not Currently  Other Topics Concern   Not on file  Social History Narrative   Works part time in [employer]    Social Determinants of Health   Financial Resource Strain: Low Risk  (06/11/2022)   Overall Financial Resource Strain (CARDIA)    Difficulty of Paying Living Expenses: Not hard at all  Food Insecurity: No Food Insecurity (06/11/2022)   Hunger Vital Sign    Worried About Running Out of Food in the Last Year: Never true    Ran Out of Food in the Last Year: Never true  Transportation Needs: No Transportation Needs  (06/11/2022)   PRAPARE - Administrator, Civil Service (Medical): No    Lack of Transportation (Non-Medical): No  Physical Activity: Sufficiently Active (06/11/2022)   Exercise Vital Sign    Days of Exercise per Week: 6 days    Minutes of Exercise per Session: 60 min  Stress: No Stress Concern Present (06/11/2022)   [employer] of Occupational Health - Occupational Stress Questionnaire    Feeling of Stress : Not at all  Social Connections: Moderately Integrated (06/11/2022)   Social Connection and Isolation Panel [NHANES]    Frequency of Communication with Friends and Family: More than three times a week    Frequency of Social Gatherings with Friends and Family: Three times a week    Attends Religious Services: More than 4 times per year    Active Member of Clubs or Organizations: Yes    Attends Banker Meetings: 1 to 4 times per year    Marital Status: Divorced     Allergies:  No Known Allergies  Metabolic Disorder Labs: Lab Results  Component Value Date   HGBA1C 5.3 12/15/2019   No results found for: "PROLACTIN" Lab Results  Component Value Date   CHOL 158 02/20/2022   TRIG 113 02/20/2022   HDL 50 02/20/2022   CHOLHDL 3.2 02/20/2022   LDLCALC 88 02/20/2022   LDLCALC 81 08/17/2021   Lab Results  Component Value Date   TSH 2.150 12/21/2020    Therapeutic Level Labs: No results found for: "LITHIUM" No results found for: "CBMZ" No results found for: "VALPROATE"  Current Medications: Current Outpatient Medications  Medication Sig Dispense Refill   acetaminophen (TYLENOL) 500 MG tablet Take 500 mg by mouth every 6 (six) hours as needed for mild pain.     amLODipine (NORVASC) 10 MG tablet Take 1 tablet (10 mg total) by mouth daily. 90 tablet 0   ARIPiprazole (ABILIFY) 10 MG tablet Take 1 tablet (10 mg total) by mouth daily. 30 tablet 1   atorvastatin (LIPITOR) 10 MG tablet Take 1 tablet (10 mg total) by mouth daily. 90 tablet 3   buPROPion  (WELLBUTRIN SR) 150 MG 12 hr tablet Take 1 tablet (150 mg total) by mouth 2 (two) times daily. 180 tablet 0   busPIRone (BUSPAR) 15 MG tablet Take 1 tablet (15 mg total) by mouth 3 (three) times daily. 90 tablet 0   Multiple Vitamin (MULTIVITAMIN WITH MINERALS) TABS tablet Take 1 tablet by mouth daily.     OVER THE COUNTER MEDICATION Cranberry, One tablet daily.     No current facility-administered medications for this visit.     Psychiatric Specialty Exam: Review of Systems  Cardiovascular:  Negative for chest pain.  Neurological:  Negative for tremors.  Psychiatric/Behavioral:  Negative for agitation and dysphoric mood.  There were no vitals taken for this visit.There is no height or weight on file to calculate BMI.  General Appearance: Casual  Eye Contact:  Fair  Speech:  Clear and Coherent  Volume:  Decreased  Mood:  Euthymic but somewhat anxious  Affect:  Congruent  Thought Process:  Goal Directed  Orientation:  Full (Time, Place, and Person)  Thought Content:  Rumination  Suicidal Thoughts:  No  Homicidal Thoughts:  No  Memory:  Immediate;   Fair  Judgement:  Fair  Insight:  Fair  Psychomotor Activity:  Normal  Concentration:  Concentration: Fair  Recall:  Good  Fund of Knowledge:Good  Language: Good  Akathisia:  No  Handed:    AIMS (if indicated):  no involuntary movements  Assets:  Architect Housing Physical Health  ADL's:  Intact  Cognition: WNL  Sleep:  Fair   Screenings: GAD-7    Garment/textile technologist Visit from 02/20/2022 in [facility] Office Visit from 08/17/2021 in [facility] Office Visit from 02/14/2021 in [facility] Video Visit from 07/04/2020 in [facility] Video Visit from 03/30/2020 in [facility]  Total GAD-7 Score 6 0 6 9 5       PHQ2-9     Flowsheet Row Clinical Support from 06/11/2022 in [facility] Office Visit from 02/20/2022 in [facility] Office Visit from 08/17/2021 in [facility] Office Visit from 06/05/2021 in [facility] Office Visit from 02/14/2021 in [facility]  PHQ-2 Total Score 0 1 2 4 2   PHQ-9 Total Score -- 3 3 6 3       Flowsheet Row Video Visit from 08/31/2021 in [facility] Office Visit from 06/05/2021 in [facility] Office Visit from 02/27/2021 in [facility]  C-SSRS RISK CATEGORY No Risk No Risk No Risk       Assessment and Plan: as follows  Prior documentation reviewed  MDD recurrent moderate: in  remission: stable, continue abilify, wellbutrin  GAD still gets anxious, increase buspar to tid of 15mg  , add ME time and trips to distract. Says going [place] visit family helps  Opiod and cocaine use: remains sober, discussed relapse prevention  Fu 3 m.  Thresa Ross, MD 6/21/20248:30 AM

## 2022-07-11 ENCOUNTER — Other Ambulatory Visit: Payer: Self-pay

## 2022-07-16 ENCOUNTER — Ambulatory Visit: Payer: Medicare Other | Admitting: Nurse Practitioner

## 2022-08-08 ENCOUNTER — Other Ambulatory Visit: Payer: Self-pay

## 2022-08-12 ENCOUNTER — Other Ambulatory Visit: Payer: Self-pay

## 2022-08-29 ENCOUNTER — Other Ambulatory Visit (HOSPITAL_COMMUNITY): Payer: Self-pay | Admitting: Psychiatry

## 2022-08-29 DIAGNOSIS — F3340 Major depressive disorder, recurrent, in remission, unspecified: Secondary | ICD-10-CM

## 2022-08-30 ENCOUNTER — Other Ambulatory Visit: Payer: Self-pay

## 2022-08-30 MED ORDER — BUPROPION HCL ER (SR) 150 MG PO TB12
150.0000 mg | ORAL_TABLET | Freq: Two times a day (BID) | ORAL | 0 refills | Status: DC
  Filled 2022-08-30: qty 180, 90d supply, fill #0

## 2022-09-02 ENCOUNTER — Other Ambulatory Visit: Payer: Self-pay

## 2022-09-05 ENCOUNTER — Other Ambulatory Visit: Payer: Self-pay | Admitting: Family Medicine

## 2022-09-05 ENCOUNTER — Other Ambulatory Visit (HOSPITAL_COMMUNITY): Payer: Self-pay | Admitting: Psychiatry

## 2022-09-05 ENCOUNTER — Other Ambulatory Visit: Payer: Self-pay

## 2022-09-05 DIAGNOSIS — I10 Essential (primary) hypertension: Secondary | ICD-10-CM

## 2022-09-05 MED ORDER — ARIPIPRAZOLE 10 MG PO TABS
10.0000 mg | ORAL_TABLET | Freq: Every day | ORAL | 1 refills | Status: DC
  Filled 2022-09-05: qty 30, 30d supply, fill #0
  Filled 2022-10-04: qty 30, 30d supply, fill #1

## 2022-09-05 MED ORDER — AMLODIPINE BESYLATE 10 MG PO TABS
10.0000 mg | ORAL_TABLET | Freq: Every day | ORAL | 0 refills | Status: AC
Start: 2022-09-05 — End: ?
  Filled 2022-09-05: qty 30, 30d supply, fill #0

## 2022-09-09 ENCOUNTER — Other Ambulatory Visit: Payer: Self-pay

## 2022-09-10 ENCOUNTER — Encounter: Payer: Self-pay | Admitting: Nurse Practitioner

## 2022-09-10 ENCOUNTER — Ambulatory Visit: Payer: Medicare Other | Attending: Nurse Practitioner | Admitting: Nurse Practitioner

## 2022-09-10 VITALS — BP 125/80 | HR 63 | Ht 69.0 in | Wt 184.4 lb

## 2022-09-10 DIAGNOSIS — M65352 Trigger finger, left little finger: Secondary | ICD-10-CM | POA: Diagnosis not present

## 2022-09-10 DIAGNOSIS — Z0000 Encounter for general adult medical examination without abnormal findings: Secondary | ICD-10-CM

## 2022-09-10 DIAGNOSIS — I10 Essential (primary) hypertension: Secondary | ICD-10-CM | POA: Diagnosis not present

## 2022-09-10 NOTE — Progress Notes (Signed)
Assessment & Plan:  Tyrone Nielsen was seen today for medical management of chronic issues.  Diagnoses and all orders for this visit:  Encounter for annual physical exam  Primary hypertension -     CMP14+EGFR  Trigger finger, left little finger -     Ambulatory referral to Hand Surgery    Patient has been counseled on age-appropriate routine health concerns for screening and prevention. These are reviewed and up-to-date. Referrals have been placed accordingly. Immunizations are up-to-date or declined.    Subjective:   Chief Complaint  Patient presents with   Medical Management of Chronic Issues   HPI Tyrone Nielsen 67 y.o. male presents to office today for follow up to HTN.     He has a past medical history of Anxiety, Depression (followed by behavioral health), ETOH abuse and Hepatitis C.    He has trigger finger of the left pinky. Will be referred to the hand specialist for this.   Blood pressure is well controlled. Taking amlodipine daily as prescribed. Weight is stable.  He walks for an hour every day.  BP Readings from Last 3 Encounters:  09/10/22 125/80  02/20/22 127/77  08/17/21 125/77     Review of Systems  Constitutional:  Negative for fever, malaise/fatigue and weight loss.  HENT: Negative.  Negative for nosebleeds.   Eyes: Negative.  Negative for blurred vision, double vision and photophobia.  Respiratory: Negative.  Negative for cough and shortness of breath.   Cardiovascular: Negative.  Negative for chest pain, palpitations and leg swelling.  Gastrointestinal: Negative.  Negative for heartburn, nausea and vomiting.  Genitourinary: Negative.   Musculoskeletal: Negative.  Negative for myalgias.       SEE HPI  Skin: Negative.   Neurological: Negative.  Negative for dizziness, focal weakness, seizures and headaches.  Endo/Heme/Allergies: Negative.   Psychiatric/Behavioral: Negative.  Negative for suicidal ideas.     Past Medical History:  Diagnosis Date    Anxiety    Depression    Hepatitis C    Hypertension    Substance abuse (HCC)     Past Surgical History:  Procedure Laterality Date   APPENDECTOMY      Family History  Problem Relation Age of Onset   Hypertension Neg Hx    Intellectual disability Neg Hx    Colon cancer Neg Hx    Esophageal cancer Neg Hx    Stomach cancer Neg Hx    Rectal cancer Neg Hx     Social History Reviewed with no changes to be made today.   Outpatient Medications Prior to Visit  Medication Sig Dispense Refill   acetaminophen (TYLENOL) 500 MG tablet Take 500 mg by mouth every 6 (six) hours as needed for mild pain.     amLODipine (NORVASC) 10 MG tablet Take 1 tablet (10 mg total) by mouth daily (Must have office visit for refills) 30 tablet 0   ARIPiprazole (ABILIFY) 10 MG tablet Take 1 tablet (10 mg total) by mouth daily. 30 tablet 1   atorvastatin (LIPITOR) 10 MG tablet Take 1 tablet (10 mg total) by mouth daily. 90 tablet 3   buPROPion (WELLBUTRIN SR) 150 MG 12 hr tablet Take 1 tablet (150 mg total) by mouth 2 (two) times daily. 180 tablet 0   busPIRone (BUSPAR) 15 MG tablet Take 1 tablet (15 mg total) by mouth 3 (three) times daily. 90 tablet 0   Multiple Vitamin (MULTIVITAMIN WITH MINERALS) TABS tablet Take 1 tablet by mouth daily.     OVER THE  COUNTER MEDICATION Cranberry, One tablet daily.     No facility-administered medications prior to visit.    No Known Allergies     Objective:    BP 125/80 (BP Location: Left Arm, Patient Position: Bed low/side rails up, Cuff Size: Normal)   Pulse 63   Ht 5\' 9"  (1.753 m)   Wt 184 lb 6.4 oz (83.6 kg)   SpO2 96%   BMI 27.23 kg/m  Wt Readings from Last 3 Encounters:  09/10/22 184 lb 6.4 oz (83.6 kg)  06/11/22 185 lb (83.9 kg)  02/20/22 185 lb 12.8 oz (84.3 kg)    Physical Exam Vitals and nursing note reviewed.  Constitutional:      Appearance: He is well-developed.  HENT:     Head: Normocephalic and atraumatic.     Right Ear: External ear  normal. There is impacted cerumen.     Left Ear: Hearing, tympanic membrane, ear canal and external ear normal. There is no impacted cerumen.     Nose: Nose normal. No mucosal edema or rhinorrhea.     Right Turbinates: Not enlarged.     Left Turbinates: Not enlarged.     Mouth/Throat:     Lips: Pink.     Mouth: Mucous membranes are moist.     Dentition: No gingival swelling, dental abscesses or gum lesions.     Pharynx: Uvula midline.     Tonsils: No tonsillar exudate. 1+ on the right. 1+ on the left.  Eyes:     General: Lids are normal. No scleral icterus.    Extraocular Movements: Extraocular movements intact.     Conjunctiva/sclera: Conjunctivae normal.     Pupils: Pupils are equal, round, and reactive to light.  Neck:     Thyroid: No thyromegaly.     Trachea: No tracheal deviation.  Cardiovascular:     Rate and Rhythm: Normal rate and regular rhythm.     Heart sounds: Normal heart sounds. No murmur heard.    No friction rub. No gallop.  Pulmonary:     Effort: Pulmonary effort is normal. No tachypnea or respiratory distress.     Breath sounds: Normal breath sounds. No decreased breath sounds, wheezing, rhonchi or rales.  Chest:     Chest wall: No mass or tenderness.  Breasts:    Right: No inverted nipple, mass, nipple discharge, skin change or tenderness.     Left: No inverted nipple, mass, nipple discharge, skin change or tenderness.  Abdominal:     General: Bowel sounds are normal. There is no distension.     Palpations: Abdomen is soft. There is no mass.     Tenderness: There is no abdominal tenderness. There is no guarding or rebound.  Musculoskeletal:        General: No tenderness. Normal range of motion.     Left hand: Deformity (trigger finger 5th digit) present.     Cervical back: Normal range of motion and neck supple.  Lymphadenopathy:     Cervical: No cervical adenopathy.  Skin:    General: Skin is warm and dry.     Capillary Refill: Capillary refill takes  less than 2 seconds.     Findings: No erythema.  Neurological:     Mental Status: He is alert and oriented to person, place, and time.     Cranial Nerves: No cranial nerve deficit.     Sensory: Sensation is intact.     Motor: No abnormal muscle tone.     Coordination: Coordination is intact. Coordination  normal.     Gait: Gait is intact.     Deep Tendon Reflexes: Reflexes normal.     Reflex Scores:      Patellar reflexes are 1+ on the right side and 1+ on the left side. Psychiatric:        Attention and Perception: Attention normal.        Mood and Affect: Mood normal.        Speech: Speech normal.        Behavior: Behavior normal. Behavior is cooperative.        Thought Content: Thought content normal.        Judgment: Judgment normal.          Patient has been counseled extensively about nutrition and exercise as well as the importance of adherence with medications and regular follow-up. The patient was given clear instructions to go to ER or return to medical center if symptoms don't improve, worsen or new problems develop. The patient verbalized understanding.   Follow-up: Return in about 3 months (around 12/11/2022).   Claiborne Rigg, FNP-BC [facility] [place], [place] [phone]   09/10/2022, 2:30 PM

## 2022-09-11 LAB — CMP14+EGFR
ALT: 21 IU/L (ref 0–44)
AST: 20 IU/L (ref 0–40)
Albumin: 4.8 g/dL (ref 3.9–4.9)
Alkaline Phosphatase: 100 IU/L (ref 44–121)
BUN/Creatinine Ratio: 12 (ref 10–24)
BUN: 13 mg/dL (ref 8–27)
Bilirubin Total: 0.5 mg/dL (ref 0.0–1.2)
CO2: 25 mmol/L (ref 20–29)
Calcium: 9.9 mg/dL (ref 8.6–10.2)
Chloride: 96 mmol/L (ref 96–106)
Creatinine, Ser: 1.08 mg/dL (ref 0.76–1.27)
Globulin, Total: 2.7 g/dL (ref 1.5–4.5)
Glucose: 83 mg/dL (ref 70–99)
Potassium: 4.3 mmol/L (ref 3.5–5.2)
Sodium: 135 mmol/L (ref 134–144)
Total Protein: 7.5 g/dL (ref 6.0–8.5)
eGFR: 75 mL/min/{1.73_m2} (ref >59)

## 2022-09-20 ENCOUNTER — Other Ambulatory Visit (HOSPITAL_COMMUNITY): Payer: Self-pay | Admitting: Psychiatry

## 2022-09-20 ENCOUNTER — Other Ambulatory Visit: Payer: Self-pay

## 2022-09-20 DIAGNOSIS — F411 Generalized anxiety disorder: Secondary | ICD-10-CM

## 2022-09-21 ENCOUNTER — Other Ambulatory Visit: Payer: Self-pay

## 2022-09-21 MED ORDER — BUSPIRONE HCL 15 MG PO TABS
15.0000 mg | ORAL_TABLET | Freq: Three times a day (TID) | ORAL | 0 refills | Status: DC
Start: 2022-09-21 — End: 2022-10-28
  Filled 2022-09-21: qty 90, 30d supply, fill #0

## 2022-09-23 ENCOUNTER — Other Ambulatory Visit: Payer: Self-pay

## 2022-09-27 DIAGNOSIS — M720 Palmar fascial fibromatosis [Dupuytren]: Secondary | ICD-10-CM | POA: Diagnosis not present

## 2022-10-04 ENCOUNTER — Other Ambulatory Visit: Payer: Self-pay

## 2022-10-04 ENCOUNTER — Other Ambulatory Visit: Payer: Self-pay | Admitting: Nurse Practitioner

## 2022-10-04 DIAGNOSIS — E782 Mixed hyperlipidemia: Secondary | ICD-10-CM

## 2022-10-04 MED ORDER — ATORVASTATIN CALCIUM 10 MG PO TABS
10.0000 mg | ORAL_TABLET | Freq: Every day | ORAL | 3 refills | Status: DC
  Filled 2022-10-04: qty 90, 90d supply, fill #0
  Filled 2023-01-06: qty 90, 90d supply, fill #1
  Filled 2023-04-04: qty 90, 90d supply, fill #2
  Filled 2023-06-29: qty 90, 90d supply, fill #3

## 2022-10-07 ENCOUNTER — Other Ambulatory Visit: Payer: Self-pay

## 2022-10-15 ENCOUNTER — Other Ambulatory Visit: Payer: Self-pay | Admitting: Family Medicine

## 2022-10-15 DIAGNOSIS — I10 Essential (primary) hypertension: Secondary | ICD-10-CM

## 2022-10-16 ENCOUNTER — Other Ambulatory Visit: Payer: Self-pay

## 2022-10-16 MED ORDER — AMLODIPINE BESYLATE 10 MG PO TABS
10.0000 mg | ORAL_TABLET | Freq: Every day | ORAL | 0 refills | Status: DC
Start: 2022-10-16 — End: 2023-01-12
  Filled 2022-10-16: qty 90, 90d supply, fill #0

## 2022-10-16 NOTE — Telephone Encounter (Signed)
Requested Prescriptions  Pending Prescriptions Disp Refills   amLODipine (NORVASC) 10 MG tablet 90 tablet 0    Sig: Take 1 tablet (10 mg total) by mouth daily (Must have office visit for refills)     Cardiovascular: Calcium Channel Blockers 2 Passed - 10/15/2022  8:01 PM      Passed - Last BP in normal range    BP Readings from Last 1 Encounters:  09/10/22 125/80         Passed - Last Heart Rate in normal range    Pulse Readings from Last 1 Encounters:  09/10/22 63         Passed - Valid encounter within last 6 months    Recent Outpatient Visits           1 month ago Encounter for annual physical exam   [place] [facility] [place], Shea Stakes, NP   7 months ago Primary hypertension   [place] [facility] [place], Shea Stakes, NP   1 year ago Encounter for annual physical exam   [facility] [place], Shea Stakes, NP   1 year ago Encounter for [employer] annual wellness exam   [facility] [place], Shea Stakes, NP   1 year ago Essential hypertension   [place] [facility] Claiborne Rigg, NP       Future Appointments             In 2 months Claiborne Rigg, NP [facility]

## 2022-10-21 ENCOUNTER — Other Ambulatory Visit: Payer: Self-pay

## 2022-10-28 ENCOUNTER — Other Ambulatory Visit: Payer: Self-pay

## 2022-10-28 ENCOUNTER — Other Ambulatory Visit (HOSPITAL_COMMUNITY): Payer: Self-pay | Admitting: Psychiatry

## 2022-10-28 DIAGNOSIS — F411 Generalized anxiety disorder: Secondary | ICD-10-CM

## 2022-10-28 MED ORDER — BUSPIRONE HCL 15 MG PO TABS
15.0000 mg | ORAL_TABLET | Freq: Three times a day (TID) | ORAL | 0 refills | Status: DC
  Filled 2022-10-28: qty 90, 30d supply, fill #0

## 2022-10-29 ENCOUNTER — Other Ambulatory Visit: Payer: Self-pay

## 2022-11-01 ENCOUNTER — Encounter (HOSPITAL_COMMUNITY): Payer: Self-pay | Admitting: Psychiatry

## 2022-11-01 ENCOUNTER — Other Ambulatory Visit: Payer: Self-pay

## 2022-11-01 ENCOUNTER — Telehealth (INDEPENDENT_AMBULATORY_CARE_PROVIDER_SITE_OTHER): Payer: Medicare Other | Admitting: Psychiatry

## 2022-11-01 DIAGNOSIS — F411 Generalized anxiety disorder: Secondary | ICD-10-CM

## 2022-11-01 DIAGNOSIS — F1490 Cocaine use, unspecified, uncomplicated: Secondary | ICD-10-CM | POA: Diagnosis not present

## 2022-11-01 DIAGNOSIS — F3340 Major depressive disorder, recurrent, in remission, unspecified: Secondary | ICD-10-CM

## 2022-11-01 DIAGNOSIS — F1121 Opioid dependence, in remission: Secondary | ICD-10-CM | POA: Diagnosis not present

## 2022-11-01 MED ORDER — BUPROPION HCL ER (SR) 150 MG PO TB12
150.0000 mg | ORAL_TABLET | Freq: Two times a day (BID) | ORAL | 0 refills | Status: DC
  Filled 2022-11-01 – 2022-12-02 (×2): qty 180, 90d supply, fill #0

## 2022-11-01 MED ORDER — ARIPIPRAZOLE 10 MG PO TABS
10.0000 mg | ORAL_TABLET | Freq: Every day | ORAL | 1 refills | Status: DC
  Filled 2022-11-01: qty 30, 30d supply, fill #0
  Filled 2022-12-02: qty 30, 30d supply, fill #1

## 2022-11-01 NOTE — Progress Notes (Signed)
[facility] Follow up visit  Patient Identification: Tyrone Nielsen MRN:  [id number] Date of Evaluation:  11/01/2022 Referral Source: [facility] Chief Complaint:  follow up,  anxiety  Visit Diagnosis:    ICD-10-CM   1. Recurrent major depressive disorder, in remission (HCC)  F33.40 buPROPion (WELLBUTRIN SR) 150 MG 12 hr tablet    2. Generalized anxiety disorder  F41.1     3. Opioid dependence in remission [facility])  F11.21      Virtual Visit via Video Note  I connected with Tyrone Nielsen on 11/01/22 at 12:30 PM EDT by a video enabled telemedicine application and verified that I am speaking with the correct person using two identifiers.  Location: Patient: home Provider: home office   I discussed the limitations of evaluation and management by telemedicine and the availability of in person appointments. The patient expressed understanding and agreed to proceed.      I discussed the assessment and treatment plan with the patient. The patient was provided an opportunity to ask questions and all were answered. The patient agreed with the plan and demonstrated an understanding of the instructions.   The patient was advised to call back or seek an in-person evaluation if the symptoms worsen or if the condition fails to improve as anticipated.  I provided 20 minutes of non-face-to-face time during this encounter.   History of Present Illness:   Patient turned 7 and had to be transferred from 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 Mom is supportive  He has remote history of using cocaine and other opioids as well remained sober for the last 20 years  Severity manageable with buspar  Aggravating factors; difficult childhood, daughter stress Modifying factors; goes to church, mom  Severity still gets  anxious at times    Duration : adult life .    Past Psychiatric History: depression, anxiety, alcohol use  Previous Psychotropic Medications: Yes   Substance Abuse History in the last 12 months:  No.  Consequences of Substance Abuse: NA  Past Medical History:  Past Medical History:  Diagnosis Date   Anxiety    Depression    Hepatitis C    Hypertension    Substance abuse (HCC)     Past Surgical History:  Procedure Laterality Date   APPENDECTOMY      Family Psychiatric History: nephew, schizoaffective disorder  Family History:  Family History  Problem Relation Age of Onset   Hypertension Neg Hx    Intellectual disability Neg Hx    Colon cancer Neg Hx    Esophageal cancer Neg Hx    Stomach cancer Neg Hx    Rectal cancer Neg Hx     Social History:   Social History   Socioeconomic History   Marital status: Divorced    Spouse name: Not on file   Number of children: 1   Years of education: 12   Highest education level: Not on file  Occupational History   Occupation: Retired    Comment: Contstruction  Tobacco Use   Smoking status: Former   Smokeless tobacco: Never   Tobacco comments:    Quit smoking 7-8 years. Does not vape or chew tobacco  Vaping Use   Vaping status: Never Used  Substance and Sexual Activity   Alcohol use: Not Currently    Comment: Prior heavy  alcohol use, 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   Drug use: Not Currently   Sexual activity: Not Currently  Other Topics Concern   Not on file  Social History Narrative   Works part time in [employer]    Social Determinants of Health   Financial Resource Strain: Low Risk  (06/11/2022)   Overall Financial Resource Strain (CARDIA)    Difficulty of Paying Living Expenses: Not hard at all  Food Insecurity: No Food Insecurity (06/11/2022)   Hunger Vital Sign    Worried About Running Out of Food in the Last Year: Never true    Ran Out of Food in the Last Year: Never true  Transportation Needs: No Transportation  Needs (06/11/2022)   PRAPARE - Administrator, Civil Service (Medical): No    Lack of Transportation (Non-Medical): No  Physical Activity: Sufficiently Active (06/11/2022)   Exercise Vital Sign    Days of Exercise per Week: 6 days    Minutes of Exercise per Session: 60 min  Stress: No Stress Concern Present (06/11/2022)   [employer] of Occupational Health - Occupational Stress Questionnaire    Feeling of Stress : Not at all  Social Connections: Moderately Integrated (06/11/2022)   Social Connection and Isolation Panel [NHANES]    Frequency of Communication with Friends and Family: More than three times a week    Frequency of Social Gatherings with Friends and Family: Three times a week    Attends Religious Services: More than 4 times per year    Active Member of Clubs or Organizations: Yes    Attends Banker Meetings: 1 to 4 times per year    Marital Status: Divorced     Allergies:  No Known Allergies  Metabolic Disorder Labs: Lab Results  Component Value Date   HGBA1C 5.3 12/15/2019   No results found for: "PROLACTIN" Lab Results  Component Value Date   CHOL 158 02/20/2022   TRIG 113 02/20/2022   HDL 50 02/20/2022   CHOLHDL 3.2 02/20/2022   LDLCALC 88 02/20/2022   LDLCALC 81 08/17/2021   Lab Results  Component Value Date   TSH 2.150 12/21/2020    Therapeutic Level Labs: No results found for: "LITHIUM" No results found for: "CBMZ" No results found for: "VALPROATE"  Current Medications: Current Outpatient Medications  Medication Sig Dispense Refill   acetaminophen (TYLENOL) 500 MG tablet Take 500 mg by mouth every 6 (six) hours as needed for mild pain.     amLODipine (NORVASC) 10 MG tablet Take 1 tablet (10 mg total) by mouth daily (Must have office visit for refills) 90 tablet 0   ARIPiprazole (ABILIFY) 10 MG tablet Take 1 tablet (10 mg total) by mouth daily. 30 tablet 1   atorvastatin (LIPITOR) 10 MG tablet Take 1 tablet (10 mg total)  by mouth daily. 90 tablet 3   buPROPion (WELLBUTRIN SR) 150 MG 12 hr tablet Take 1 tablet (150 mg total) by mouth 2 (two) times daily. 180 tablet 0   busPIRone (BUSPAR) 15 MG tablet Take 1 tablet (15 mg total) by mouth 3 (three) times daily. 90 tablet 0   Multiple Vitamin (MULTIVITAMIN WITH MINERALS) TABS tablet Take 1 tablet by mouth daily.     OVER THE COUNTER MEDICATION Cranberry, One tablet daily.     No current facility-administered medications for this visit.     Psychiatric Specialty Exam: Review of Systems  Cardiovascular:  Negative for chest pain.  Neurological:  Negative for tremors.  Psychiatric/Behavioral:  Negative for agitation  and dysphoric mood.     There were no vitals taken for this visit.There is no height or weight on file to calculate BMI.  General Appearance: Casual  Eye Contact:  Fair  Speech:  Clear and Coherent  Volume:  Decreased  Mood:  Euthymic but somewhat anxious  Affect:  Congruent  Thought Process:  Goal Directed  Orientation:  Full (Time, Place, and Person)  Thought Content:  Rumination  Suicidal Thoughts:  No  Homicidal Thoughts:  No  Memory:  Immediate;   Fair  Judgement:  Fair  Insight:  Fair  Psychomotor Activity:  Normal  Concentration:  Concentration: Fair  Recall:  Good  Fund of Knowledge:Good  Language: Good  Akathisia:  No  Handed:    AIMS (if indicated):  no involuntary movements  Assets:  Architect Housing Physical Health  ADL's:  Intact  Cognition: WNL  Sleep:  Fair   Screenings: GAD-7    Garment/textile technologist Visit from 09/10/2022 in [facility] Office Visit from 02/20/2022 in [facility] Office Visit from 08/17/2021 in [facility] Office Visit from 02/14/2021 in [facility] Video Visit from [facility]04/2020 in [facility]  Total  GAD-7 Score 4 6 0 6 9      PHQ2-9    Flowsheet Row Office Visit from 09/10/2022 in [facility] Clinical Support from 06/11/2022 in [facility] Office Visit from 02/20/2022 in [facility] Office Visit from 08/17/2021 in [facility] Office Visit from 06/05/2021 in [facility]  PHQ-2 Total Score 2 0 1 2 4   PHQ-9 Total Score 6 -- 3 3 6       Flowsheet Row Video Visit from 08/31/2021 in [facility] at [facility] Office Visit from 06/05/2021 in [facility] at [facility] Office Visit from 02/27/2021 in [facility] at [facility]  C-SSRS RISK CATEGORY No Risk No Risk No Risk       Assessment and Plan: as follows  Prior documentation reviewed  MDD recurrent moderate:  in remission continue abilify, on tremors, continue wellburin  GAD: still gets anxious at times, buspar was increased last visit, will continue tid and work on coping skills and distractions   Opiod and cocaine use: remains stable and sober, continue relapse prevnetion Fu 3 - 4 m.  Thresa Ross, MD 10/18/202412:34 PM

## 2022-11-04 ENCOUNTER — Other Ambulatory Visit: Payer: Self-pay

## 2022-11-04 MED ORDER — DIAZEPAM 5 MG PO TABS
ORAL_TABLET | ORAL | 0 refills | Status: DC
  Filled 2022-11-04: qty 2, 1d supply, fill #0

## 2022-11-06 ENCOUNTER — Other Ambulatory Visit: Payer: Self-pay

## 2022-11-06 DIAGNOSIS — M720 Palmar fascial fibromatosis [Dupuytren]: Secondary | ICD-10-CM | POA: Diagnosis not present

## 2022-11-06 MED ORDER — OXYCODONE-ACETAMINOPHEN 5-325 MG PO TABS
1.0000 | ORAL_TABLET | ORAL | 0 refills | Status: DC
  Filled 2022-11-06: qty 25, 4d supply, fill #0

## 2022-11-08 DIAGNOSIS — M25642 Stiffness of left hand, not elsewhere classified: Secondary | ICD-10-CM | POA: Diagnosis not present

## 2022-11-12 DIAGNOSIS — M79642 Pain in left hand: Secondary | ICD-10-CM | POA: Diagnosis not present

## 2022-11-12 DIAGNOSIS — M25642 Stiffness of left hand, not elsewhere classified: Secondary | ICD-10-CM | POA: Diagnosis not present

## 2022-11-13 ENCOUNTER — Other Ambulatory Visit: Payer: Self-pay

## 2022-11-13 MED ORDER — OXYCODONE-ACETAMINOPHEN 5-325 MG PO TABS
1.0000 | ORAL_TABLET | ORAL | 0 refills | Status: DC
  Filled 2022-11-13: qty 25, 4d supply, fill #0

## 2022-11-20 ENCOUNTER — Other Ambulatory Visit: Payer: Self-pay

## 2022-11-20 MED ORDER — OXYCODONE-ACETAMINOPHEN 5-325 MG PO TABS
1.0000 | ORAL_TABLET | ORAL | 0 refills | Status: DC | PRN
  Filled 2022-11-20: qty 8, 1d supply, fill #0

## 2022-11-26 ENCOUNTER — Other Ambulatory Visit (HOSPITAL_COMMUNITY): Payer: Self-pay | Admitting: Psychiatry

## 2022-11-26 ENCOUNTER — Other Ambulatory Visit: Payer: Self-pay

## 2022-11-26 DIAGNOSIS — F411 Generalized anxiety disorder: Secondary | ICD-10-CM

## 2022-11-26 MED ORDER — BUSPIRONE HCL 15 MG PO TABS
15.0000 mg | ORAL_TABLET | Freq: Three times a day (TID) | ORAL | 0 refills | Status: DC
  Filled 2022-11-26: qty 90, 30d supply, fill #0

## 2022-11-27 ENCOUNTER — Other Ambulatory Visit: Payer: Self-pay

## 2022-11-27 MED ORDER — OXYCODONE-ACETAMINOPHEN 5-325 MG PO TABS
1.0000 | ORAL_TABLET | ORAL | 0 refills | Status: DC
  Filled 2022-11-27: qty 25, 4d supply, fill #0

## 2022-12-02 ENCOUNTER — Other Ambulatory Visit: Payer: Self-pay

## 2022-12-04 ENCOUNTER — Other Ambulatory Visit: Payer: Self-pay

## 2022-12-08 ENCOUNTER — Other Ambulatory Visit: Payer: Self-pay

## 2022-12-09 ENCOUNTER — Other Ambulatory Visit: Payer: Self-pay

## 2022-12-09 MED ORDER — OXYCODONE-ACETAMINOPHEN 5-325 MG PO TABS
ORAL_TABLET | ORAL | 0 refills | Status: DC
  Filled 2022-12-09: qty 25, 4d supply, fill #0

## 2022-12-10 ENCOUNTER — Other Ambulatory Visit: Payer: Self-pay

## 2022-12-15 ENCOUNTER — Other Ambulatory Visit: Payer: Self-pay

## 2022-12-16 ENCOUNTER — Other Ambulatory Visit: Payer: Self-pay

## 2022-12-16 MED ORDER — OXYCODONE-ACETAMINOPHEN 5-325 MG PO TABS
ORAL_TABLET | ORAL | 0 refills | Status: DC
  Filled 2022-12-16: qty 25, 4d supply, fill #0

## 2022-12-17 ENCOUNTER — Ambulatory Visit: Payer: Medicare Other | Admitting: Nurse Practitioner

## 2022-12-20 ENCOUNTER — Other Ambulatory Visit: Payer: Self-pay

## 2022-12-23 ENCOUNTER — Other Ambulatory Visit (HOSPITAL_COMMUNITY): Payer: Self-pay | Admitting: Psychiatry

## 2022-12-23 ENCOUNTER — Other Ambulatory Visit: Payer: Self-pay

## 2022-12-23 DIAGNOSIS — F411 Generalized anxiety disorder: Secondary | ICD-10-CM

## 2022-12-23 MED ORDER — BUSPIRONE HCL 15 MG PO TABS
15.0000 mg | ORAL_TABLET | Freq: Three times a day (TID) | ORAL | 0 refills | Status: DC
  Filled 2022-12-23: qty 90, 30d supply, fill #0

## 2022-12-23 MED ORDER — OXYCODONE-ACETAMINOPHEN 5-325 MG PO TABS
ORAL_TABLET | ORAL | 0 refills | Status: DC
  Filled 2022-12-23: qty 25, 4d supply, fill #0

## 2022-12-27 ENCOUNTER — Other Ambulatory Visit: Payer: Self-pay

## 2022-12-27 MED ORDER — OXYCODONE-ACETAMINOPHEN 5-325 MG PO TABS
1.0000 | ORAL_TABLET | ORAL | 0 refills | Status: DC | PRN
  Filled 2022-12-27: qty 25, 4d supply, fill #0

## 2022-12-29 ENCOUNTER — Other Ambulatory Visit (HOSPITAL_COMMUNITY): Payer: Self-pay | Admitting: Psychiatry

## 2022-12-30 ENCOUNTER — Other Ambulatory Visit: Payer: Self-pay

## 2022-12-30 MED ORDER — ARIPIPRAZOLE 10 MG PO TABS
10.0000 mg | ORAL_TABLET | Freq: Every day | ORAL | 1 refills | Status: DC
  Filled 2022-12-30: qty 30, 30d supply, fill #0
  Filled 2023-02-01: qty 30, 30d supply, fill #1

## 2022-12-31 ENCOUNTER — Other Ambulatory Visit: Payer: Self-pay

## 2023-01-01 ENCOUNTER — Other Ambulatory Visit: Payer: Self-pay

## 2023-01-02 ENCOUNTER — Other Ambulatory Visit: Payer: Self-pay

## 2023-01-03 ENCOUNTER — Other Ambulatory Visit: Payer: Self-pay

## 2023-01-03 MED ORDER — OXYCODONE-ACETAMINOPHEN 5-325 MG PO TABS
1.0000 | ORAL_TABLET | ORAL | 0 refills | Status: DC
  Filled 2023-01-03 (×2): qty 25, 4d supply, fill #0

## 2023-01-09 ENCOUNTER — Other Ambulatory Visit: Payer: Self-pay

## 2023-01-10 ENCOUNTER — Other Ambulatory Visit: Payer: Self-pay

## 2023-01-12 ENCOUNTER — Other Ambulatory Visit: Payer: Self-pay | Admitting: Nurse Practitioner

## 2023-01-12 DIAGNOSIS — I10 Essential (primary) hypertension: Secondary | ICD-10-CM

## 2023-01-13 ENCOUNTER — Other Ambulatory Visit: Payer: Self-pay

## 2023-01-13 MED ORDER — AMLODIPINE BESYLATE 10 MG PO TABS
10.0000 mg | ORAL_TABLET | Freq: Every day | ORAL | 0 refills | Status: DC
  Filled 2023-01-13: qty 90, 90d supply, fill #0

## 2023-01-14 ENCOUNTER — Other Ambulatory Visit: Payer: Self-pay

## 2023-01-14 MED ORDER — OXYCODONE-ACETAMINOPHEN 5-325 MG PO TABS
1.0000 | ORAL_TABLET | ORAL | 0 refills | Status: DC
  Filled 2023-01-14: qty 25, 3d supply, fill #0

## 2023-01-20 ENCOUNTER — Other Ambulatory Visit (HOSPITAL_COMMUNITY): Payer: Self-pay | Admitting: Psychiatry

## 2023-01-20 ENCOUNTER — Other Ambulatory Visit: Payer: Self-pay

## 2023-01-20 DIAGNOSIS — F411 Generalized anxiety disorder: Secondary | ICD-10-CM

## 2023-01-20 MED ORDER — OXYCODONE-ACETAMINOPHEN 5-325 MG PO TABS
1.0000 | ORAL_TABLET | ORAL | 0 refills | Status: DC
  Filled 2023-01-20: qty 25, 4d supply, fill #0

## 2023-01-20 MED ORDER — BUSPIRONE HCL 15 MG PO TABS
15.0000 mg | ORAL_TABLET | Freq: Three times a day (TID) | ORAL | 0 refills | Status: DC
  Filled 2023-01-20 – 2023-01-24 (×2): qty 90, 30d supply, fill #0

## 2023-01-21 ENCOUNTER — Other Ambulatory Visit: Payer: Self-pay

## 2023-01-22 ENCOUNTER — Other Ambulatory Visit: Payer: Self-pay

## 2023-01-24 ENCOUNTER — Other Ambulatory Visit: Payer: Self-pay

## 2023-01-28 ENCOUNTER — Other Ambulatory Visit: Payer: Self-pay

## 2023-02-04 ENCOUNTER — Other Ambulatory Visit: Payer: Self-pay

## 2023-02-05 ENCOUNTER — Ambulatory Visit: Payer: Medicare Other | Admitting: Nurse Practitioner

## 2023-02-20 DIAGNOSIS — M65352 Trigger finger, left little finger: Secondary | ICD-10-CM | POA: Diagnosis not present

## 2023-02-20 DIAGNOSIS — M720 Palmar fascial fibromatosis [Dupuytren]: Secondary | ICD-10-CM | POA: Diagnosis not present

## 2023-02-25 ENCOUNTER — Other Ambulatory Visit (HOSPITAL_COMMUNITY): Payer: Self-pay | Admitting: Psychiatry

## 2023-02-25 DIAGNOSIS — F411 Generalized anxiety disorder: Secondary | ICD-10-CM

## 2023-02-25 DIAGNOSIS — F3340 Major depressive disorder, recurrent, in remission, unspecified: Secondary | ICD-10-CM

## 2023-02-26 ENCOUNTER — Other Ambulatory Visit (HOSPITAL_COMMUNITY): Payer: Self-pay | Admitting: *Deleted

## 2023-02-26 ENCOUNTER — Other Ambulatory Visit: Payer: Self-pay

## 2023-02-26 DIAGNOSIS — F411 Generalized anxiety disorder: Secondary | ICD-10-CM

## 2023-02-26 DIAGNOSIS — F3340 Major depressive disorder, recurrent, in remission, unspecified: Secondary | ICD-10-CM

## 2023-02-26 MED ORDER — BUPROPION HCL ER (SR) 150 MG PO TB12
150.0000 mg | ORAL_TABLET | Freq: Two times a day (BID) | ORAL | 0 refills | Status: DC
Start: 2023-02-26 — End: 2023-06-01
  Filled 2023-02-26 – 2023-03-05 (×2): qty 180, 90d supply, fill #0

## 2023-02-26 MED ORDER — ARIPIPRAZOLE 10 MG PO TABS
10.0000 mg | ORAL_TABLET | Freq: Every day | ORAL | 0 refills | Status: DC
  Filled 2023-02-26 – 2023-03-05 (×2): qty 30, 30d supply, fill #0

## 2023-02-26 MED ORDER — BUSPIRONE HCL 15 MG PO TABS
15.0000 mg | ORAL_TABLET | Freq: Three times a day (TID) | ORAL | 0 refills | Status: DC
Start: 2023-02-26 — End: 2023-03-27
  Filled 2023-02-26: qty 90, 30d supply, fill #0

## 2023-02-27 ENCOUNTER — Other Ambulatory Visit: Payer: Self-pay

## 2023-02-28 ENCOUNTER — Telehealth (HOSPITAL_COMMUNITY): Payer: Medicare Other | Admitting: Psychiatry

## 2023-02-28 ENCOUNTER — Encounter (HOSPITAL_COMMUNITY): Payer: Self-pay | Admitting: Psychiatry

## 2023-02-28 DIAGNOSIS — F1121 Opioid dependence, in remission: Secondary | ICD-10-CM | POA: Diagnosis not present

## 2023-02-28 DIAGNOSIS — F1491 Cocaine use, unspecified, in remission: Secondary | ICD-10-CM

## 2023-02-28 DIAGNOSIS — F411 Generalized anxiety disorder: Secondary | ICD-10-CM

## 2023-02-28 DIAGNOSIS — F3340 Major depressive disorder, recurrent, in remission, unspecified: Secondary | ICD-10-CM

## 2023-02-28 NOTE — Progress Notes (Signed)
[facility] Follow up visit  Patient Identification: Tyrone Nielsen MRN:  [id number] Date of Evaluation:  02/28/2023 Referral Source: [facility] Chief Complaint:  follow up,  anxiety  Visit Diagnosis:    ICD-10-CM   1. Recurrent major depressive disorder, in remission (HCC)  F33.40     2. Generalized anxiety disorder  F41.1     3. Opioid dependence in remission [facility])  F11.21     Virtual Visit via Video Note  I connected with Tyrone Nielsen on 02/28/23 at 11:00 AM EST by a video enabled telemedicine application and verified that I am speaking with the correct person using two identifiers.  Location: Patient: home Provider: home office   I discussed the limitations of evaluation and management by telemedicine and the availability of in person appointments. The patient expressed understanding and agreed to proceed.      I discussed the assessment and treatment plan with the patient. The patient was provided an opportunity to ask questions and all were answered. The patient agreed with the plan and demonstrated an understanding of the instructions.   The patient was advised to call back or seek an in-person evaluation if the symptoms worsen or if the condition fails to improve as anticipated.  I provided 18 minutes of non-face-to-face time during this encounter.   History of Present Illness:   Patient turned 20 and had to be transferred from [facility] since not living in [employer] Patient has been diagnosed with depression anxiety and alcohol dependence in remission referred to establish care   On eval doing fair, remains sober and tolerating meds  Mom lives with him and he helps her out  He has remote history of using cocaine and other opioids as well remained sober for the last 20 years  Severity manageable with buspar  Aggravating factors; difficult childhood, daughter stress at times Modifying factors; goes to church, mom  Severity still gets anxious at times    Duration : adult  life .    Past Psychiatric History: depression, anxiety, alcohol use  Previous Psychotropic Medications: Yes   Substance Abuse History in the last 12 months:  No.  Consequences of Substance Abuse: NA  Past Medical History:  Past Medical History:  Diagnosis Date   Anxiety    Depression    Hepatitis C    Hypertension    Substance abuse (HCC)     Past Surgical History:  Procedure Laterality Date   APPENDECTOMY      Family Psychiatric History: nephew, schizoaffective disorder  Family History:  Family History  Problem Relation Age of Onset   Hypertension Neg Hx    Intellectual disability Neg Hx    Colon cancer Neg Hx    Esophageal cancer Neg Hx    Stomach cancer Neg Hx    Rectal cancer Neg Hx     Social History:   Social History   Socioeconomic History   Marital status: Divorced    Spouse name: Not on file   Number of children: 1   Years of education: 12   Highest education level: Not on file  Occupational History   Occupation: Retired    Comment: Contstruction  Tobacco Use   Smoking status: Former   Smokeless tobacco: Never   Tobacco comments:    Quit smoking 7-8 years. Does not vape or chew tobacco  Vaping Use   Vaping status: Never Used  Substance and Sexual Activity   Alcohol use: Not Currently    Comment: Prior heavy alcohol use, sober >13  years   Drug use: Not Currently   Sexual activity: Not Currently  Other Topics Concern   Not on file  Social History Narrative   Works part time in [employer]    Social Drivers of Health   Financial Resource Strain: Low Risk  (06/11/2022)   Overall Financial Resource Strain (CARDIA)    Difficulty of Paying Living Expenses: Not hard at all  Food Insecurity: No Food Insecurity (06/11/2022)   Hunger Vital Sign    Worried About Running Out of Food in the Last Year: Never true    Ran Out of Food in the Last Year: Never true  Transportation Needs: No Transportation Needs (06/11/2022)   PRAPARE -  Administrator, Civil Service (Medical): No    Lack of Transportation (Non-Medical): No  Physical Activity: Sufficiently Active (06/11/2022)   Exercise Vital Sign    Days of Exercise per Week: 6 days    Minutes of Exercise per Session: 60 min  Stress: No Stress Concern Present (06/11/2022)   [employer] of Occupational Health - Occupational Stress Questionnaire    Feeling of Stress : Not at all  Social Connections: Moderately Integrated (06/11/2022)   Social Connection and Isolation Panel [NHANES]    Frequency of Communication with Friends and Family: More than three times a week    Frequency of Social Gatherings with Friends and Family: Three times a week    Attends Religious Services: More than 4 times per year    Active Member of Clubs or Organizations: Yes    Attends Banker Meetings: 1 to 4 times per year    Marital Status: Divorced     Allergies:  No Known Allergies  Metabolic Disorder Labs: Lab Results  Component Value Date   HGBA1C 5.3 12/15/2019   No results found for: "PROLACTIN" Lab Results  Component Value Date   CHOL 158 02/20/2022   TRIG 113 02/20/2022   HDL 50 02/20/2022   CHOLHDL 3.2 02/20/2022   LDLCALC 88 02/20/2022   LDLCALC 81 08/17/2021   Lab Results  Component Value Date   TSH 2.150 12/21/2020    Therapeutic Level Labs: No results found for: "LITHIUM" No results found for: "CBMZ" No results found for: "VALPROATE"  Current Medications: Current Outpatient Medications  Medication Sig Dispense Refill   acetaminophen (TYLENOL) 500 MG tablet Take 500 mg by mouth every 6 (six) hours as needed for mild pain.     amLODipine (NORVASC) 10 MG tablet Take 1 tablet (10 mg total) by mouth daily (Must have office visit for refills) 90 tablet 0   ARIPiprazole (ABILIFY) 10 MG tablet Take 1 tablet (10 mg total) by mouth daily. 30 tablet 0   atorvastatin (LIPITOR) 10 MG tablet Take 1 tablet (10 mg total) by mouth daily. 90 tablet 3    buPROPion (WELLBUTRIN SR) 150 MG 12 hr tablet Take 1 tablet (150 mg total) by mouth 2 (two) times daily. 180 tablet 0   busPIRone (BUSPAR) 15 MG tablet Take 1 tablet (15 mg total) by mouth 3 (three) times daily. 90 tablet 0   diazepam (VALIUM) 5 MG tablet Take 1-2 tablet(s) by mouth 30 minutes prior to MRI. 2 tablet 0   Multiple Vitamin (MULTIVITAMIN WITH MINERALS) TABS tablet Take 1 tablet by mouth daily.     OVER THE COUNTER MEDICATION Cranberry, One tablet daily.     oxyCODONE-acetaminophen (PERCOCET/ROXICET) 5-325 MG tablet Take 1-2 tablets by mouth every 4-6 hours as needed for pain. NOT TO EXCEED  6 tablets a day. 8 tablet 0   oxyCODONE-acetaminophen (PERCOCET/ROXICET) 5-325 MG tablet Take 1-2 tablets by mouth every 4-6 hours as needed for pain. NOT TO EXCEED 6 tablets a day. 25 tablet 0   oxyCODONE-acetaminophen (PERCOCET/ROXICET) 5-325 MG tablet Take 1-2 tablets by mouth every 4-6 hours as needed.Do not exceed 6 tablets per day. 25 tablet 0   No current facility-administered medications for this visit.     Psychiatric Specialty Exam: Review of Systems  Cardiovascular:  Negative for chest pain.  Neurological:  Negative for tremors.  Psychiatric/Behavioral:  Negative for agitation and dysphoric mood.     There were no vitals taken for this visit.There is no height or weight on file to calculate BMI.  General Appearance: Casual  Eye Contact:  Fair  Speech:  Clear and Coherent  Volume:  Decreased  Mood: fair   Affect:  Congruent  Thought Process:  Goal Directed  Orientation:  Full (Time, Place, and Person)  Thought Content:  Rumination  Suicidal Thoughts:  No  Homicidal Thoughts:  No  Memory:  Immediate;   Fair  Judgement:  Fair  Insight:  Fair  Psychomotor Activity:  Normal  Concentration:  Concentration: Fair  Recall:  Good  Fund of Knowledge:Good  Language: Good  Akathisia:  No  Handed:    AIMS (if indicated):  no involuntary movements  Assets:  Tour manager Housing Physical Health  ADL's:  Intact  Cognition: WNL  Sleep:  Fair   Screenings: GAD-7    Garment/textile technologist Visit from 09/10/2022 in [facility] [place] - A Dept Of [place]. [facility] Office Visit from 02/20/2022 in [facility] [place] - A Dept Of El[place]a Bridegroom. [facility] Office Visit from 08/17/2021 in [facility] [place] - A Dept Of Eligha Bridegroom. [facility] Office Visit from 02/14/2021 in [facility] [place] - A Dept Of Eligha Bridegroom. [facility] Video Visit from 07/04/2020 in [facility]  Total GAD-7 Score 4 6 0 6 9      PHQ2-9    Flowsheet Row Office Visit from 09/10/2022 in [facility] [place] - A Dept Of [place]. [facility] Clinical Support from 06/11/2022 in [facility] [place] - A Dept Of Eligha Bridegroom. [facility] Office Visit from 02/20/2022 in [facility] - A Dept Of Eligha Bridegroom. [facility] Office Visit from 08/17/2021 in [facility] [place] - A Dept Of Eligha Bridegroom. [facility] Office Visit from 06/05/2021 in [facility] [place] - A Dept Of Eligha Bridegroom. [facility]  PHQ-2 Total Score 2 0 1 2 4   PHQ-9 Total Score 6 -- 3 3 6       Flowsheet Row Video Visit from 08/31/2021 in [facility]patient Behavioral Health at [facility] Office Visit from 06/05/2021 in [facility] Outpatient Behavior[facility] Health at [facility] Office Visit from 02/27/2021 in [facility]patient Behavioral Health at [facility]  C-SSRS RISK CATEGORY No Risk No Risk No Risk       Assessment and Plan: as follows   Prior documentation reviewed  MDD recurrent moderate:  in remission remains stable continue wellbutrin, abilify, no tremors  GAD: manageable with buspar uptil tid  Opiod and cocaine use: remains stable and sober,  continue relapse prevnetion Fu 46m.  Thresa Ross, MD 2/14/202511:15 AM

## 2023-03-05 ENCOUNTER — Other Ambulatory Visit: Payer: Self-pay

## 2023-03-11 ENCOUNTER — Other Ambulatory Visit: Payer: Self-pay

## 2023-03-20 DIAGNOSIS — M720 Palmar fascial fibromatosis [Dupuytren]: Secondary | ICD-10-CM | POA: Diagnosis not present

## 2023-03-20 DIAGNOSIS — M65352 Trigger finger, left little finger: Secondary | ICD-10-CM | POA: Diagnosis not present

## 2023-03-21 ENCOUNTER — Telehealth: Payer: Self-pay | Admitting: Nurse Practitioner

## 2023-03-21 NOTE — Telephone Encounter (Signed)
 Contacted pt no vm set-up to leave message to confirm appt

## 2023-03-25 ENCOUNTER — Ambulatory Visit: Payer: Medicare Other | Admitting: Nurse Practitioner

## 2023-03-27 ENCOUNTER — Other Ambulatory Visit (HOSPITAL_COMMUNITY): Payer: Self-pay | Admitting: Psychiatry

## 2023-03-27 DIAGNOSIS — F411 Generalized anxiety disorder: Secondary | ICD-10-CM

## 2023-03-28 ENCOUNTER — Other Ambulatory Visit: Payer: Self-pay

## 2023-03-28 MED ORDER — BUSPIRONE HCL 15 MG PO TABS
15.0000 mg | ORAL_TABLET | Freq: Three times a day (TID) | ORAL | 0 refills | Status: DC
  Filled 2023-03-28: qty 90, 30d supply, fill #0

## 2023-03-28 MED ORDER — ARIPIPRAZOLE 10 MG PO TABS
10.0000 mg | ORAL_TABLET | Freq: Every day | ORAL | 0 refills | Status: DC
  Filled 2023-03-28 – 2023-04-04 (×2): qty 30, 30d supply, fill #0

## 2023-03-31 ENCOUNTER — Other Ambulatory Visit: Payer: Self-pay

## 2023-04-04 ENCOUNTER — Other Ambulatory Visit: Payer: Self-pay

## 2023-04-07 ENCOUNTER — Other Ambulatory Visit: Payer: Self-pay

## 2023-04-15 ENCOUNTER — Other Ambulatory Visit: Payer: Self-pay | Admitting: Nurse Practitioner

## 2023-04-15 DIAGNOSIS — I10 Essential (primary) hypertension: Secondary | ICD-10-CM

## 2023-04-16 ENCOUNTER — Other Ambulatory Visit: Payer: Self-pay

## 2023-04-16 MED ORDER — AMLODIPINE BESYLATE 10 MG PO TABS
10.0000 mg | ORAL_TABLET | Freq: Every day | ORAL | 0 refills | Status: DC
  Filled 2023-04-16: qty 30, 30d supply, fill #0

## 2023-04-21 ENCOUNTER — Other Ambulatory Visit: Payer: Self-pay

## 2023-04-28 ENCOUNTER — Other Ambulatory Visit (HOSPITAL_COMMUNITY): Payer: Self-pay | Admitting: Psychiatry

## 2023-04-28 ENCOUNTER — Other Ambulatory Visit: Payer: Self-pay

## 2023-04-28 DIAGNOSIS — F411 Generalized anxiety disorder: Secondary | ICD-10-CM

## 2023-04-28 MED ORDER — BUSPIRONE HCL 15 MG PO TABS
15.0000 mg | ORAL_TABLET | Freq: Three times a day (TID) | ORAL | 1 refills | Status: DC
  Filled 2023-04-28: qty 90, 30d supply, fill #0
  Filled 2023-06-01: qty 90, 30d supply, fill #1

## 2023-05-03 ENCOUNTER — Other Ambulatory Visit (HOSPITAL_COMMUNITY): Payer: Self-pay | Admitting: Psychiatry

## 2023-05-05 ENCOUNTER — Other Ambulatory Visit: Payer: Self-pay

## 2023-05-05 MED ORDER — ARIPIPRAZOLE 10 MG PO TABS
10.0000 mg | ORAL_TABLET | Freq: Every day | ORAL | 0 refills | Status: DC
  Filled 2023-05-05: qty 30, 30d supply, fill #0

## 2023-05-06 ENCOUNTER — Other Ambulatory Visit: Payer: Self-pay

## 2023-05-13 ENCOUNTER — Encounter: Payer: Self-pay | Admitting: Nurse Practitioner

## 2023-05-13 ENCOUNTER — Other Ambulatory Visit: Payer: Self-pay

## 2023-05-13 ENCOUNTER — Ambulatory Visit: Attending: Nurse Practitioner | Admitting: Nurse Practitioner

## 2023-05-13 VITALS — BP 111/70 | HR 56 | Resp 20 | Ht 69.0 in | Wt 192.4 lb

## 2023-05-13 DIAGNOSIS — I10 Essential (primary) hypertension: Secondary | ICD-10-CM

## 2023-05-13 DIAGNOSIS — R7989 Other specified abnormal findings of blood chemistry: Secondary | ICD-10-CM

## 2023-05-13 DIAGNOSIS — E785 Hyperlipidemia, unspecified: Secondary | ICD-10-CM | POA: Diagnosis not present

## 2023-05-13 MED ORDER — AMLODIPINE BESYLATE 10 MG PO TABS
10.0000 mg | ORAL_TABLET | Freq: Every day | ORAL | 1 refills | Status: DC
  Filled 2023-05-13: qty 90, 90d supply, fill #0
  Filled 2023-08-14: qty 90, 90d supply, fill #1

## 2023-05-13 NOTE — Progress Notes (Signed)
 Assessment & Plan:  Tyrone Nielsen was seen today for medical management of chronic issues.  Diagnoses and all orders for this visit:  Essential hypertension -     amLODipine  (NORVASC ) 10 MG tablet; Take 1 tablet (10 mg total) by mouth daily. -     CMP14+EGFR Continue all antihypertensives as prescribed.  Reminded to bring in blood pressure log for follow  up appointment.  RECOMMENDATIONS: DASH/Mediterranean Diets are healthier choices for HTN.    Dyslipidemia, goal LDL below 100 -     Lipid panel INSTRUCTIONS: Work on a low fat, heart healthy diet and participate in regular aerobic exercise program by working out at least 150 minutes per week; 5 days a week-30 minutes per day. Avoid red meat/beef/steak,  fried foods. junk foods, sodas, sugary drinks, unhealthy snacking, alcohol and smoking.  Drink at least 80 oz of water per day and monitor your carbohydrate intake daily.    Abnormal CBC -     CBC with Differential    Patient has been counseled on age-appropriate routine health concerns for screening and prevention. These are reviewed and up-to-date. Referrals have been placed accordingly. Immunizations are up-to-date or declined.    Subjective:   Chief Complaint  Patient presents with   Medical Management of Chronic Issues    Tyrone Nielsen 68 y.o. male presents to office today for follow up to HTN   Blood pressure is well controlled with amlodipine  10 mg daily. BP Readings from Last 3 Encounters:  05/13/23 111/70  09/10/22 125/80  02/20/22 127/77    Tremor: He complains of tremor. Tremor primarily involves the left hand.  Onset of symptoms was several months ago after his surgery for Dupuytren's contracture. Symptoms are currently of mild severity. Tremor exacerbated by no known factors. Tremor is alleviated by nothing.  It resolves on its own. Symptoms occur intermittently and last  minutes to hours . He also describes symptoms of unilateral hand tremor. He denies bilateral  hand tremor, voice change, rigidity, postural changes, difficulty in initiating movement, change in handwriting, difficulty with walking, difficult with posture, difficulty with swallowing, and balance problems.  He is currently taking Abilify  which may be contributing to his symptoms.  I will send a message to Dr. Aram Knights his psychiatrist as well   Review of Systems  Constitutional:  Negative for fever, malaise/fatigue and weight loss.  HENT: Negative.  Negative for nosebleeds.   Eyes: Negative.  Negative for blurred vision, double vision and photophobia.  Respiratory: Negative.  Negative for cough and shortness of breath.   Cardiovascular: Negative.  Negative for chest pain, palpitations and leg swelling.  Gastrointestinal: Negative.  Negative for heartburn, nausea and vomiting.  Musculoskeletal: Negative.  Negative for myalgias.  Neurological:  Positive for tremors. Negative for dizziness, focal weakness, seizures and headaches.  Psychiatric/Behavioral:  Negative for suicidal ideas. The patient is nervous/anxious.     Past Medical History:  Diagnosis Date   Anxiety    Depression    Hepatitis C    Hypertension    Substance abuse (HCC)     Past Surgical History:  Procedure Laterality Date   APPENDECTOMY      Family History  Problem Relation Age of Onset   Hypertension Neg Hx    Intellectual disability Neg Hx    Colon cancer Neg Hx    Esophageal cancer Neg Hx    Stomach cancer Neg Hx    Rectal cancer Neg Hx     Social History Reviewed with no changes to  be made today.   Outpatient Medications Prior to Visit  Medication Sig Dispense Refill   acetaminophen  (TYLENOL ) 500 MG tablet Take 500 mg by mouth every 6 (six) hours as needed for mild pain.     ARIPiprazole  (ABILIFY ) 10 MG tablet Take 1 tablet (10 mg total) by mouth daily. 30 tablet 0   atorvastatin  (LIPITOR) 10 MG tablet Take 1 tablet (10 mg total) by mouth daily. 90 tablet 3   buPROPion  (WELLBUTRIN  SR) 150 MG 12 hr  tablet Take 1 tablet (150 mg total) by mouth 2 (two) times daily. 180 tablet 0   busPIRone  (BUSPAR ) 15 MG tablet Take 1 tablet (15 mg total) by mouth 3 (three) times daily. 90 tablet 1   Multiple Vitamin (MULTIVITAMIN WITH MINERALS) TABS tablet Take 1 tablet by mouth daily.     OVER THE COUNTER MEDICATION Cranberry, One tablet daily.     amLODipine  (NORVASC ) 10 MG tablet Take 1 tablet (10 mg total) by mouth daily (Must have office visit for refills) 30 tablet 0   diazepam  (VALIUM ) 5 MG tablet Take 1-2 tablet(s) by mouth 30 minutes prior to MRI. (Patient not taking: Reported on 05/13/2023) 2 tablet 0   oxyCODONE -acetaminophen  (PERCOCET/ROXICET) 5-325 MG tablet Take 1-2 tablets by mouth every 4-6 hours as needed for pain. NOT TO EXCEED 6 tablets a day. (Patient not taking: Reported on 05/13/2023) 8 tablet 0   oxyCODONE -acetaminophen  (PERCOCET/ROXICET) 5-325 MG tablet Take 1-2 tablets by mouth every 4-6 hours as needed for pain. NOT TO EXCEED 6 tablets a day. (Patient not taking: Reported on 05/13/2023) 25 tablet 0   oxyCODONE -acetaminophen  (PERCOCET/ROXICET) 5-325 MG tablet Take 1-2 tablets by mouth every 4-6 hours as needed.Do not exceed 6 tablets per day. (Patient not taking: Reported on 05/13/2023) 25 tablet 0   No facility-administered medications prior to visit.    No Known Allergies     Objective:    BP 111/70 (BP Location: Left Arm, Patient Position: Sitting, Cuff Size: Normal)   Pulse (!) 56   Resp 20   Ht 5\' 9"  (1.753 m)   Wt 192 lb 6.4 oz (87.3 kg)   SpO2 100%   BMI 28.41 kg/m  Wt Readings from Last 3 Encounters:  05/13/23 192 lb 6.4 oz (87.3 kg)  09/10/22 184 lb 6.4 oz (83.6 kg)  06/11/22 185 lb (83.9 kg)    Physical Exam Vitals and nursing note reviewed.  Constitutional:      Appearance: He is well-developed.  HENT:     Head: Normocephalic and atraumatic.  Cardiovascular:     Rate and Rhythm: Regular rhythm. Bradycardia present.     Heart sounds: Normal heart sounds. No  murmur heard.    No friction rub. No gallop.  Pulmonary:     Effort: Pulmonary effort is normal. No tachypnea or respiratory distress.     Breath sounds: Normal breath sounds. No decreased breath sounds, wheezing, rhonchi or rales.  Chest:     Chest wall: No tenderness.  Abdominal:     General: Bowel sounds are normal.     Palpations: Abdomen is soft.  Musculoskeletal:        General: Normal range of motion.     Cervical back: Normal range of motion.  Skin:    General: Skin is warm and dry.  Neurological:     Mental Status: He is alert and oriented to person, place, and time.     Coordination: Coordination normal.  Psychiatric:        Behavior:  Behavior normal. Behavior is cooperative.        Thought Content: Thought content normal.        Judgment: Judgment normal.          Patient has been counseled extensively about nutrition and exercise as well as the importance of adherence with medications and regular follow-up. The patient was given clear instructions to go to ER or return to medical center if symptoms don't improve, worsen or new problems develop. The patient verbalized understanding.   Follow-up: Return in about 4 months (around 09/11/2023) for physical.   Collins Dean, FNP-BC [facility] [place], [place] [phone]   05/13/2023, 10:37 AM

## 2023-05-14 LAB — CBC WITH DIFFERENTIAL/PLATELET
Basophils Absolute: 0.1 10*3/uL (ref 0.0–0.2)
Basos: 1 %
EOS (ABSOLUTE): 0.1 10*3/uL (ref 0.0–0.4)
Eos: 2 %
Hematocrit: 45.4 % (ref 37.5–51.0)
Hemoglobin: 15.8 g/dL (ref 13.0–17.7)
Immature Grans (Abs): 0 10*3/uL (ref 0.0–0.1)
Immature Granulocytes: 0 %
Lymphocytes Absolute: 1.5 10*3/uL (ref 0.7–3.1)
Lymphs: 19 %
MCH: 31.3 pg (ref 26.6–33.0)
MCHC: 34.8 g/dL (ref 31.5–35.7)
MCV: 90 fL (ref 79–97)
Monocytes Absolute: 0.9 10*3/uL (ref 0.1–0.9)
Monocytes: 11 %
Neutrophils Absolute: 5.5 10*3/uL (ref 1.4–7.0)
Neutrophils: 67 %
Platelets: 266 10*3/uL (ref 150–450)
RBC: 5.04 x10E6/uL (ref 4.14–5.80)
RDW: 11.4 % — ABNORMAL LOW (ref 11.6–15.4)
WBC: 8.1 10*3/uL (ref 3.4–10.8)

## 2023-05-14 LAB — CMP14+EGFR
ALT: 24 IU/L (ref 0–44)
AST: 18 IU/L (ref 0–40)
Albumin: 4.6 g/dL (ref 3.9–4.9)
Alkaline Phosphatase: 106 IU/L (ref 44–121)
BUN/Creatinine Ratio: 12 (ref 10–24)
BUN: 12 mg/dL (ref 8–27)
Bilirubin Total: 0.4 mg/dL (ref 0.0–1.2)
CO2: 23 mmol/L (ref 20–29)
Calcium: 9.9 mg/dL (ref 8.6–10.2)
Chloride: 97 mmol/L (ref 96–106)
Creatinine, Ser: 1.04 mg/dL (ref 0.76–1.27)
Globulin, Total: 2.3 g/dL (ref 1.5–4.5)
Glucose: 95 mg/dL (ref 70–99)
Potassium: 4.5 mmol/L (ref 3.5–5.2)
Sodium: 132 mmol/L — ABNORMAL LOW (ref 134–144)
Total Protein: 6.9 g/dL (ref 6.0–8.5)
eGFR: 78 mL/min/{1.73_m2} (ref >59)

## 2023-05-14 LAB — LIPID PANEL
Chol/HDL Ratio: 2.7 ratio (ref 0.0–5.0)
Cholesterol, Total: 136 mg/dL (ref 100–199)
HDL: 51 mg/dL (ref >39)
LDL Chol Calc (NIH): 66 mg/dL (ref 0–99)
Triglycerides: 103 mg/dL (ref 0–149)
VLDL Cholesterol Cal: 19 mg/dL (ref 5–40)

## 2023-05-18 ENCOUNTER — Encounter: Payer: Self-pay | Admitting: Nurse Practitioner

## 2023-06-01 ENCOUNTER — Other Ambulatory Visit (HOSPITAL_COMMUNITY): Payer: Self-pay | Admitting: Psychiatry

## 2023-06-01 DIAGNOSIS — F3340 Major depressive disorder, recurrent, in remission, unspecified: Secondary | ICD-10-CM

## 2023-06-02 ENCOUNTER — Other Ambulatory Visit: Payer: Self-pay

## 2023-06-02 MED ORDER — BUPROPION HCL ER (SR) 150 MG PO TB12
150.0000 mg | ORAL_TABLET | Freq: Two times a day (BID) | ORAL | 0 refills | Status: DC
  Filled 2023-06-02: qty 180, 90d supply, fill #0

## 2023-06-02 MED ORDER — ARIPIPRAZOLE 10 MG PO TABS
10.0000 mg | ORAL_TABLET | Freq: Every day | ORAL | 0 refills | Status: DC
  Filled 2023-06-02: qty 30, 30d supply, fill #0

## 2023-06-17 ENCOUNTER — Ambulatory Visit: Payer: Medicare Other | Attending: Nurse Practitioner

## 2023-06-17 VITALS — Ht 69.0 in | Wt 190.0 lb

## 2023-06-17 DIAGNOSIS — Z0000 Encounter for general adult medical examination without abnormal findings: Secondary | ICD-10-CM

## 2023-06-17 NOTE — Progress Notes (Signed)
 Because this visit was a virtual/telehealth visit,  certain criteria was not obtained, such a blood pressure, CBG if applicable, and timed get up and go. Any medications not marked as "taking" were not mentioned during the medication reconciliation part of the visit. Any vitals not documented were not able to be obtained due to this being a telehealth visit or patient was unable to self-report a recent blood pressure reading due to a lack of equipment at home via telehealth. Vitals that have been documented are verbally provided by the patient.   Subjective:   Tyrone Nielsen is a 68 y.o. who presents for a Medicare Wellness preventive visit.  As a reminder, Annual Wellness Visits don't include a physical exam, and some assessments may be limited, especially if this visit is performed virtually. We may recommend an in-person follow-up visit with your provider if needed.  Visit Complete: Virtual I connected with  Tyrone Nielsen on 06/17/23 by a audio enabled telemedicine application and verified that I am speaking with the correct person using two identifiers.  Patient Location: Home  Provider Location: Office/Clinic  I discussed the limitations of evaluation and management by telemedicine. The patient expressed understanding and agreed to proceed.  Vital Signs: Because this visit was a virtual/telehealth visit, some criteria may be missing or patient reported. Any vitals not documented were not able to be obtained and vitals that have been documented are patient reported.  VideoDeclined- This patient declined Librarian, academic. Therefore the visit was completed with audio only.  Persons Participating in Visit: Patient.  AWV Questionnaire: No: Patient Medicare AWV questionnaire was not completed prior to this visit.  Cardiac Risk Factors include: advanced age (>70men, >62 women);dyslipidemia;hypertension;male gender     Objective:     Today's Vitals    06/17/23 0916  Weight: 190 lb (86.2 kg)  Height: 5\' 9"  (1.753 m)  PainSc: 0-No pain   Body mass index is 28.06 kg/m.     06/17/2023    9:17 AM 06/11/2022    8:53 AM  Advanced Directives  Does Patient Have a Medical Advance Directive? No No  Would patient like information on creating a medical advance directive? No - Patient declined Yes (MAU/Ambulatory/Procedural Areas - Information given)    Current Medications (verified) Outpatient Encounter Medications as of 06/17/2023  Medication Sig   acetaminophen  (TYLENOL ) 500 MG tablet Take 500 mg by mouth every 6 (six) hours as needed for mild pain.   amLODipine  (NORVASC ) 10 MG tablet Take 1 tablet (10 mg total) by mouth daily.   ARIPiprazole  (ABILIFY ) 10 MG tablet Take 1 tablet (10 mg total) by mouth daily.   atorvastatin  (LIPITOR) 10 MG tablet Take 1 tablet (10 mg total) by mouth daily.   buPROPion  (WELLBUTRIN  SR) 150 MG 12 hr tablet Take 1 tablet (150 mg total) by mouth 2 (two) times daily.   busPIRone  (BUSPAR ) 15 MG tablet Take 1 tablet (15 mg total) by mouth 3 (three) times daily.   Multiple Vitamin (MULTIVITAMIN WITH MINERALS) TABS tablet Take 1 tablet by mouth daily.   OVER THE COUNTER MEDICATION Cranberry, One tablet daily.   No facility-administered encounter medications on file as of 06/17/2023.    Allergies (verified) Patient has no known allergies.   History: Past Medical History:  Diagnosis Date   Anxiety    Depression    Hepatitis C    Hypertension    Substance abuse (HCC)    Past Surgical History:  Procedure Laterality Date   APPENDECTOMY  Family History  Problem Relation Age of Onset   Hypertension Neg Hx    Intellectual disability Neg Hx    Colon cancer Neg Hx    Esophageal cancer Neg Hx    Stomach cancer Neg Hx    Rectal cancer Neg Hx    Social History   Socioeconomic History   Marital status: Divorced    Spouse name: Not on file   Number of children: 1   Years of education: 12   Highest education  level: Not on file  Occupational History   Occupation: Retired    Comment: Contstruction  Tobacco Use   Smoking status: Former   Smokeless tobacco: Never   Tobacco comments:    Quit smoking 7-8 years. 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   Drug use: Not Currently   Sexual activity: Not Currently  Other Topics Concern   Not on file  Social History Narrative   Works part time in [employer]    Social Drivers of Health   Financial Resource Strain: Low Risk  (06/17/2023)   Overall Financial Resource Strain (CARDIA)    Difficulty of Paying Living Expenses: Not hard at all  Food Insecurity: No Food Insecurity (06/17/2023)   Hunger Vital Sign    Worried About Running Out of Food in the Last Year: Never true    Ran Out of Food in the Last Year: Never true  Transportation Needs: No Transportation Needs (06/17/2023)   PRAPARE - Administrator, Civil Service (Medical): No    Lack of Transportation (Non-Medical): No  Physical Activity: Sufficiently Active (06/17/2023)   Exercise Vital Sign    Days of Exercise per Week: 7 days    Minutes of Exercise per Session: 50 min  Stress: No Stress Concern Present (06/17/2023)   [employer] of Occupational Health - Occupational Stress Questionnaire    Feeling of Stress : Not at all  Social Connections: Moderately Integrated (06/17/2023)   Social Connection and Isolation Panel [NHANES]    Frequency of Communication with Friends and Family: More than three times a week    Frequency of Social Gatherings with Friends and Family: Three times a week    Attends Religious Services: More than 4 times per year    Active Member of Clubs or Organizations: Yes    Attends Banker Meetings: 1 to 4 times per year    Marital Status: Divorced    Tobacco Counseling Counseling given: Not  Answered Tobacco comments: Quit smoking 7-8 years. Does not vape or chew tobacco    Clinical Intake:  Pre-visit preparation completed: Yes  Pain : No/denies pain Pain Score: 0-No pain     BMI - recorded: 28.06 Nutritional Status: BMI 25 -29 Overweight Nutritional Risks: None Diabetes: No  Lab Results  Component Value Date   HGBA1C 5.3 12/15/2019     How often do you need to have someone help you when you read instructions, pamphlets, or other written materials from your doctor or pharmacy?: 1 - Never  Interpreter Needed?: No  Information entered by :: Tynslee Bowlds N. Brylinn Teaney, LPN.   Activities of Daily Living     06/17/2023    9:21 AM  In your present state of health, do you have any difficulty performing the following activities:  Hearing? 0  Vision? 0  Difficulty concentrating or making decisions? 0  Walking or climbing stairs? 0  Dressing or bathing? 0  Doing errands, shopping? 0  Preparing Food and eating ? N  Using the Toilet? N  In the past six months, have you accidently leaked urine? N  Do you have problems with loss of bowel control? N  Managing your Medications? N  Managing your Finances? N  Housekeeping or managing your Housekeeping? N    Patient Care Team: Collins Dean, NP as PCP - General (Nurse Practitioner) Shauna Del, DO as Consulting Physician (Sports Medicine) Wray Heady, MD as Consulting Physician (Psychiatry)  I have updated your Care Teams any recent Medical Services you may have received from other providers in the past year.     Assessment:    This is a routine wellness examination for Kimsey.  Hearing/Vision screen Hearing Screening - Comments:: Denies hearing difficulties. Vision Screening - Comments:: Wears reading glasses - not up to date with routine eye exams.    Goals Addressed             This Visit's Progress    06/17/2023: Continue to maintain my health.         Depression Screen     06/17/2023     9:25 AM 05/13/2023    9:15 AM 09/10/2022    1:34 PM 06/11/2022    8:52 AM 02/20/2022   11:25 AM 08/17/2021   10:20 AM 06/05/2021    1:52 PM  PHQ 2/9 Scores  PHQ - 2 Score 1 1 2  0 1 2 4   PHQ- 9 Score 2 3 6  3 3 6     Fall Risk     06/17/2023    9:18 AM 05/13/2023    9:15 AM 09/10/2022    1:38 PM 06/11/2022    8:48 AM 02/20/2022   11:11 AM  Fall Risk   Falls in the past year? 0 0 0 1 0  Number falls in past yr: 0 0 0 1 0  Injury with Fall? 0 0 0 1 0  Risk for fall due to : No Fall Risks No Fall Risks  History of fall(s);Impaired balance/gait;Impaired mobility No Fall Risks  Follow up Falls evaluation completed Falls evaluation completed Falls evaluation completed Education provided;Falls prevention discussed;Falls evaluation completed Falls evaluation completed    MEDICARE RISK AT HOME:  Medicare Risk at Home Any stairs in or around the home?: Yes If so, are there any without handrails?: No Home free of loose throw rugs in walkways, pet beds, electrical cords, etc?: Yes Adequate lighting in your home to reduce risk of falls?: Yes Life alert?: No Use of a cane, walker or w/c?: No Grab bars in the bathroom?: Yes (SHOWER) Shower chair or bench in shower?: No Elevated toilet seat or a handicapped toilet?: No  TIMED UP AND GO:  Was the test performed?  No  Cognitive Function: Declined/Normal: No cognitive concerns noted by patient or family. Patient alert, oriented, able to answer questions appropriately and recall recent events. No signs of memory loss or confusion.    06/17/2023    9:19 AM  MMSE - Mini Mental State Exam  Not completed: Unable to complete        06/17/2023    9:19 AM 06/11/2022    8:54 AM  6CIT Screen  What Year? 0 points 0 points  What month? 0 points 0 points  What time? 0 points 0 points  Count back from 20 0 points 0 points  Months in reverse 0 points 0 points  Repeat phrase 0 points 0 points  Total Score 0 points 0 points    Immunizations Immunization  History  Administered Date(s) Administered   PFIZER(Purple Top)SARS-COV-2 Vaccination 01/24/2019, 02/14/2019   Pneumococcal-Unspecified 05/02/2021   Zoster Recombinant(Shingrix ) 06/13/2017, 01/24/2021, 05/03/2021    Screening Tests Health Maintenance  Topic Date Due   COVID-19 Vaccine (3 - 2024-25 season) 09/15/2022   Medicare Annual Wellness (AWV)  06/11/2023   Pneumonia Vaccine 18+ Years old (1 of 1 - PCV) 09/10/2023 (Originally 05/09/2005)   INFLUENZA VACCINE  08/15/2023   Fecal DNA (Cologuard)  09/14/2024   Hepatitis C Screening  Completed   Zoster Vaccines- Shingrix   Completed   HPV VACCINES  Aged Out   Meningococcal B Vaccine  Aged Out   DTaP/Tdap/Td  Discontinued    Health Maintenance  Health Maintenance Due  Topic Date Due   COVID-19 Vaccine (3 - 2024-25 season) 09/15/2022   Medicare Annual Wellness (AWV)  06/11/2023   Health Maintenance Items Addressed: Yes Patient aware of current care gaps.  Immunization record was verified by [employer].  Patient declined vaccinations.  Additional Screening:  Vision Screening: Recommended annual ophthalmology exams for early detection of glaucoma and other disorders of the eye. Would you like a referral to an eye doctor? No    Dental Screening: Recommended annual dental exams for proper oral hygiene  Community Resource Referral / Chronic Care Management: CRR required this visit?  No   CCM required this visit?  No   Plan:    I have personally reviewed and noted the following in the patient's chart:   Medical and social history Use of alcohol, tobacco or illicit drugs  Current medications and supplements including opioid prescriptions. Patient is not currently taking opioid prescriptions. Functional ability and status Nutritional status Physical activity Advanced directives List of other physicians Hospitalizations, surgeries, and ER visits in previous 12 months Vitals Screenings to include cognitive, depression, and  falls Referrals and appointments  In addition, I have reviewed and discussed with patient certain preventive protocols, quality metrics, and best practice recommendations. A written personalized care plan for preventive services as well as general preventive health recommendations were provided to patient.   Margette Sheldon, LPN   03/16/4399   After Visit Summary: (MyChart) Due to this being a telephonic visit, the after visit summary with patients personalized plan was offered to patient via MyChart   Notes: Patient aware of current care gaps.  Immunization record was verified by [employer].  Patient declined vaccinations.

## 2023-06-17 NOTE — Patient Instructions (Signed)
 Tyrone Nielsen , Thank you for taking time out of your busy schedule to complete your Annual Wellness Visit with me. I enjoyed our conversation and look forward to speaking with you again next year. I, as well as your care team,  appreciate your ongoing commitment to your health goals. Please review the following plan we discussed and let me know if I can assist you in the future. Your Game plan/ To Do List    Referrals: If you haven't heard from the office you've been referred to, please reach out to them at the phone provided.   Follow up Visits: Next Medicare AWV with our clinical staff: 06/22/2024 at 9:10 a.m. Phone Visit with Nurse Health Advisor   Have you seen your provider in the last 6 months (3 months if uncontrolled diabetes)? Yes Next Office Visit with your provider: 09/12/2023 2:50 p.m. Physical Exam with Zelda, NP  Clinician Recommendations:  Aim for 30 minutes of exercise or brisk walking, 6-8 glasses of water, and 5 servings of fruits and vegetables each day.       This is a list of the screening recommended for you and due dates:  Health Maintenance  Topic Date Due   COVID-19 Vaccine (3 - 2024-25 season) 09/15/2022   Pneumonia Vaccine (1 of 1 - PCV) 09/10/2023*   Flu Shot  08/15/2023   Medicare Annual Wellness Visit  06/16/2024   Cologuard (Stool DNA test)  09/14/2024   Hepatitis C Screening  Completed   Zoster (Shingles) Vaccine  Completed   HPV Vaccine  Aged Out   Meningitis B Vaccine  Aged Out   DTaP/Tdap/Td vaccine  Discontinued  *Topic was postponed. The date shown is not the original due date.    Advanced directives: (Declined) Advance directive discussed with you today. Even though you declined this today, please call our office should you change your mind, and we can give you the proper paperwork for you to fill out. Advance Care Planning is important because it:  [x]  Makes sure you receive the medical care that is consistent with your values, goals, and  preferences  [x]  It provides guidance to your family and loved ones and reduces their decisional burden about whether or not they are making the right decisions based on your wishes.  Follow the link provided in your after visit summary or read over the paperwork we have mailed to you to help you started getting your Advance Directives in place. If you need assistance in completing these, please reach out to us  so that we can help you!  See attachments for Preventive Care and Fall Prevention Tips.

## 2023-06-27 ENCOUNTER — Telehealth (HOSPITAL_COMMUNITY): Payer: Medicare Other | Admitting: Psychiatry

## 2023-06-29 ENCOUNTER — Other Ambulatory Visit (HOSPITAL_COMMUNITY): Payer: Self-pay | Admitting: Psychiatry

## 2023-06-30 ENCOUNTER — Other Ambulatory Visit: Payer: Self-pay

## 2023-06-30 ENCOUNTER — Telehealth (HOSPITAL_COMMUNITY): Admitting: Psychiatry

## 2023-06-30 MED ORDER — ARIPIPRAZOLE 10 MG PO TABS
10.0000 mg | ORAL_TABLET | Freq: Every day | ORAL | 0 refills | Status: DC
  Filled 2023-06-30: qty 30, 30d supply, fill #0

## 2023-07-01 ENCOUNTER — Ambulatory Visit (HOSPITAL_COMMUNITY): Admitting: Psychiatry

## 2023-07-10 ENCOUNTER — Other Ambulatory Visit: Payer: Self-pay

## 2023-07-10 ENCOUNTER — Ambulatory Visit (HOSPITAL_COMMUNITY): Admitting: Psychiatry

## 2023-07-10 ENCOUNTER — Encounter (HOSPITAL_COMMUNITY): Payer: Self-pay | Admitting: Psychiatry

## 2023-07-10 VITALS — BP 124/84 | HR 75 | Ht 69.0 in | Wt 188.0 lb

## 2023-07-10 DIAGNOSIS — F411 Generalized anxiety disorder: Secondary | ICD-10-CM

## 2023-07-10 DIAGNOSIS — F3340 Major depressive disorder, recurrent, in remission, unspecified: Secondary | ICD-10-CM

## 2023-07-10 DIAGNOSIS — F1121 Opioid dependence, in remission: Secondary | ICD-10-CM

## 2023-07-10 MED ORDER — BUPROPION HCL ER (SR) 150 MG PO TB12
150.0000 mg | ORAL_TABLET | Freq: Two times a day (BID) | ORAL | 0 refills | Status: DC
  Filled 2023-07-10 – 2023-08-30 (×2): qty 180, 90d supply, fill #0

## 2023-07-10 MED ORDER — BUSPIRONE HCL 15 MG PO TABS
15.0000 mg | ORAL_TABLET | Freq: Three times a day (TID) | ORAL | 1 refills | Status: DC
  Filled 2023-07-10: qty 90, 30d supply, fill #0
  Filled 2023-08-30: qty 90, 30d supply, fill #1

## 2023-07-10 MED ORDER — ARIPIPRAZOLE 10 MG PO TABS
10.0000 mg | ORAL_TABLET | Freq: Every day | ORAL | 0 refills | Status: DC
  Filled 2023-07-10 – 2023-07-30 (×2): qty 30, 30d supply, fill #0

## 2023-07-10 NOTE — Progress Notes (Signed)
 [facility] Follow up visit  Patient Identification: Tyrone Nielsen MRN:  [id number] Date of Evaluation:  07/10/2023 Referral Source: [facility] Chief Complaint:  follow up,  anxiety  Visit Diagnosis:    ICD-10-CM   1. Recurrent major depressive disorder, in remission (HCC)  F33.40 buPROPion  (WELLBUTRIN  SR) 150 MG 12 hr tablet    2. Generalized anxiety disorder  F41.1 busPIRone  (BUSPAR ) 15 MG tablet    3. Opioid dependence in remission (HCC)  F11.21        History of Present Illness:   Patient turned 43 and had to be transferred from [facility] since not living in [place] Patient has been diagnosed with depression anxiety and alcohol dependence in remission referred to establish care   On eval doing fair, goes out, helps out mom who lives with him  He has remote history of using cocaine and other opioids as well remained sober for the last 20 years  Severity manageable with buspar  takes bid sometimes tid Labs reviewed LIPID profile normal, follows with PCP with labs Advised to get EKG done No palpitations  Aggravating factors; difficult childhood, daughter stress at times Modifying factors; goes to church, mom  Severity manageable   Duration : adult life .    Past Psychiatric History: depression, anxiety, alcohol use  Previous Psychotropic Medications: Yes   Substance Abuse History in the last 12 months:  No.  Consequences of Substance Abuse: NA  Past Medical History:  Past Medical History:  Diagnosis Date   Anxiety    Depression    Hepatitis C    Hypertension    Substance abuse (HCC)     Past Surgical History:  Procedure Laterality Date   APPENDECTOMY      Family Psychiatric History: nephew, schizoaffective disorder  Family History:  Family History  Problem Relation Age of Onset   Hypertension Neg Hx    Intellectual disability Neg Hx    Colon cancer Neg Hx    Esophageal cancer Neg Hx    Stomach cancer Neg Hx    Rectal cancer Neg Hx     Social History:    Social History   Socioeconomic History   Marital status: Divorced    Spouse name: Not on file   Number of children: 1   Years of education: 12   Highest education level: Not on file  Occupational History   Occupation: Retired    Comment: Contstruction  Tobacco Use   Smoking status: Former   Smokeless tobacco: Never   Tobacco comments:    Quit smoking 7-8 years. 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   Drug use: Not Currently   Sexual activity: Not Currently  Other Topics Concern   Not on file  Social History Narrative   Works part time in [employer]    Social Drivers of Health   Financial Resource Strain: Low Risk  (06/17/2023)   Overall Financial Resource Strain (CARDIA)    Difficulty of Paying Living Expenses: Not hard at all  Food Insecurity: No Food Insecurity (06/17/2023)   Hunger Vital Sign    Worried About Running Out of Food in the Last Year: Never true    Ran Out of Food in the Last Year: Never true  Transportation Needs: No Transportation Needs (06/17/2023)   PRAPARE - Administrator, Civil Service (Medical):  No    Lack of Transportation (Non-Medical): No  Physical Activity: Sufficiently Active (06/17/2023)   Exercise Vital Sign    Days of Exercise per Week: 7 days    Minutes of Exercise per Session: 50 min  Stress: No Stress Concern Present (06/17/2023)   [employer] of Occupational Health - Occupational Stress Questionnaire    Feeling of Stress : Not at all  Social Connections: Moderately Integrated (06/17/2023)   Social Connection and Isolation Panel    Frequency of Communication with Friends and Family: More than three times a week    Frequency of Social Gatherings with Friends and Family: Three times a week    Attends Religious Services: More than 4 times per year    Active Member of Clubs or  Organizations: Yes    Attends Banker Meetings: 1 to 4 times per year    Marital Status: Divorced     Allergies:  No Known Allergies  Metabolic Disorder Labs: Lab Results  Component Value Date   HGBA1C 5.3 12/15/2019   No results found for: PROLACTIN Lab Results  Component Value Date   CHOL 136 05/13/2023   TRIG 103 05/13/2023   HDL 51 05/13/2023   CHOLHDL 2.7 05/13/2023   LDLCALC 66 05/13/2023   LDLCALC 88 02/20/2022   Lab Results  Component Value Date   TSH 2.150 12/21/2020    Therapeutic Level Labs: No results found for: LITHIUM No results found for: CBMZ No results found for: VALPROATE  Current Medications: Current Outpatient Medications  Medication Sig Dispense Refill   acetaminophen  (TYLENOL ) 500 MG tablet Take 500 mg by mouth every 6 (six) hours as needed for mild pain.     amLODipine  (NORVASC ) 10 MG tablet Take 1 tablet (10 mg total) by mouth daily. 90 tablet 1   ARIPiprazole  (ABILIFY ) 10 MG tablet Take 1 tablet (10 mg total) by mouth daily. 30 tablet 0   atorvastatin  (LIPITOR) 10 MG tablet Take 1 tablet (10 mg total) by mouth daily. 90 tablet 3   buPROPion  (WELLBUTRIN  SR) 150 MG 12 hr tablet Take 1 tablet (150 mg total) by mouth 2 (two) times daily. 180 tablet 0   busPIRone  (BUSPAR ) 15 MG tablet Take 1 tablet (15 mg total) by mouth 3 (three) times daily. 90 tablet 1   Multiple Vitamin (MULTIVITAMIN WITH MINERALS) TABS tablet Take 1 tablet by mouth daily.     OVER THE COUNTER MEDICATION Cranberry, One tablet daily.     No current facility-administered medications for this visit.     Psychiatric Specialty Exam: Review of Systems  Cardiovascular:  Negative for chest pain.  Neurological:  Negative for tremors.  Psychiatric/Behavioral:  Negative for agitation and dysphoric mood.     Blood pressure 124/84, pulse 75, height 5' 9 (1.753 m), weight 188 lb (85.3 kg).Body mass index is 27.76 kg/m.  General Appearance: Casual  Eye Contact:   Fair  Speech:  Clear and Coherent  Volume:  Decreased  Mood: fair   Affect:  Congruent  Thought Process:  Goal Directed  Orientation:  Full (Time, Place, and Person)  Thought Content:  Rumination  Suicidal Thoughts:  No  Homicidal Thoughts:  No  Memory:  Immediate;   Fair  Judgement:  Fair  Insight:  Fair  Psychomotor Activity:  Normal  Concentration:  Concentration: Fair  Recall:  Good  Fund of Knowledge:Good  Language: Good  Akathisia:  No  Handed:    AIMS (if indicated):  no involuntary movements  Assets:  Architect Housing Physical Health  ADL's:  Intact  Cognition: WNL  Sleep:  Fair   Screenings: GAD-7    Garment/textile technologist Visit from 05/13/2023 in [facility] - A Dept Of [place]. [facility] Office Visit from 09/10/2022 in [facility] - A Dept Of [place]. [facility] Office Visit from 02/20/2022 in [facility] [place] - A Dept Of [place] DEL. [facility] Office Visit from 08/17/2021 in [facility] [place] - A Dept Of [place] DEL. [facility] Office Visit from 02/14/2021 in [facility] [place] - A Dept Of [place]. [facility]  Total GAD-7 Score 5 4 6  0 6   PHQ2-9    Flowsheet Row Clinical Support from 06/17/2023 in [facility] - A Dept Of [place]. [facility] Office Visit from 05/13/2023 in [facility] [place] - A Dept Of [place] [facility]. [facility] [address] Office Visit from 09/10/2022 in [facility] [place] - A Dept Of [place] DEL. [facility] Clinical Support from 06/11/2022 in [facility] [place] - A Dept Of [place] DEL. [facility] Office Visit from 02/20/2022 in [facility] - A Dept Of [place] DEL. [facility]  PHQ-2 Total Score 1 1 2  0 1  PHQ-9 Total Score 2 3 6  -- 3   Flowsheet Row Video Visit from 08/31/2021 in  [facility] at [facility] Office Visit from 06/05/2021 in [facility] at [facility] Office Visit from 02/27/2021 in [facility] at [facility]  C-SSRS RISK CATEGORY No Risk No Risk No Risk    Assessment and Plan: as follows   Prior documentation reviewed  MDD recurrent moderate:  in remission  stable continue abilify , wellbutrin  Lipids normal, advised to get EKG, follows with pcp with labs I sent message to PCp for arranged EKG and review QTC when done  GAD: manageable continue buspar  tid or bid   Opiod and cocaine use: remains sober, discussed relapse prevention Direct care time spent 18 min FU 4 - 6 m  Jackey Flight, MD 6/26/202510:20 AM

## 2023-07-14 ENCOUNTER — Other Ambulatory Visit: Payer: Self-pay

## 2023-07-28 ENCOUNTER — Telehealth: Payer: Self-pay | Admitting: Nurse Practitioner

## 2023-07-28 NOTE — Telephone Encounter (Signed)
 Contacted pt left vm to call back to resch appt provider out fo the office

## 2023-07-29 NOTE — Telephone Encounter (Signed)
 2nd attempt left vm to resch appt

## 2023-07-30 ENCOUNTER — Telehealth: Payer: Self-pay | Admitting: Nurse Practitioner

## 2023-07-30 ENCOUNTER — Other Ambulatory Visit: Payer: Self-pay

## 2023-07-30 NOTE — Telephone Encounter (Signed)
 Called pt to reschedule appt. Pt did not answer but I was able to LVM for pt at this time.

## 2023-07-31 ENCOUNTER — Other Ambulatory Visit: Payer: Self-pay

## 2023-08-04 ENCOUNTER — Other Ambulatory Visit: Payer: Self-pay

## 2023-08-18 ENCOUNTER — Other Ambulatory Visit: Payer: Self-pay

## 2023-08-30 ENCOUNTER — Other Ambulatory Visit (HOSPITAL_COMMUNITY): Payer: Self-pay | Admitting: Psychiatry

## 2023-08-30 ENCOUNTER — Other Ambulatory Visit: Payer: Self-pay

## 2023-08-31 MED ORDER — ARIPIPRAZOLE 10 MG PO TABS
10.0000 mg | ORAL_TABLET | Freq: Every day | ORAL | 1 refills | Status: DC
  Filled 2023-08-31: qty 30, 30d supply, fill #0
  Filled 2023-09-28: qty 30, 30d supply, fill #1

## 2023-09-01 ENCOUNTER — Other Ambulatory Visit: Payer: Self-pay

## 2023-09-02 ENCOUNTER — Telehealth: Payer: Self-pay | Admitting: Nurse Practitioner

## 2023-09-02 NOTE — Telephone Encounter (Signed)
 Pt unconfirmed appt 8/19 lvm

## 2023-09-03 ENCOUNTER — Ambulatory Visit: Attending: Nurse Practitioner | Admitting: Nurse Practitioner

## 2023-09-03 ENCOUNTER — Other Ambulatory Visit: Payer: Self-pay

## 2023-09-03 ENCOUNTER — Encounter: Payer: Self-pay | Admitting: Nurse Practitioner

## 2023-09-03 VITALS — BP 120/76 | HR 71 | Resp 19 | Ht 69.0 in | Wt 185.4 lb

## 2023-09-03 DIAGNOSIS — Z0000 Encounter for general adult medical examination without abnormal findings: Secondary | ICD-10-CM | POA: Diagnosis not present

## 2023-09-03 DIAGNOSIS — H6123 Impacted cerumen, bilateral: Secondary | ICD-10-CM | POA: Diagnosis not present

## 2023-09-03 DIAGNOSIS — R251 Tremor, unspecified: Secondary | ICD-10-CM

## 2023-09-03 DIAGNOSIS — I10 Essential (primary) hypertension: Secondary | ICD-10-CM | POA: Diagnosis not present

## 2023-09-03 DIAGNOSIS — I499 Cardiac arrhythmia, unspecified: Secondary | ICD-10-CM

## 2023-09-03 DIAGNOSIS — E782 Mixed hyperlipidemia: Secondary | ICD-10-CM

## 2023-09-03 MED ORDER — CARBAMIDE PEROXIDE 6.5 % OT SOLN
5.0000 [drp] | Freq: Two times a day (BID) | OTIC | 0 refills | Status: AC
  Filled 2023-09-03 – 2023-12-27 (×7): qty 15, 30d supply, fill #0

## 2023-09-03 MED ORDER — AMLODIPINE BESYLATE 10 MG PO TABS
10.0000 mg | ORAL_TABLET | Freq: Every day | ORAL | 1 refills | Status: AC
  Filled 2023-09-03 – 2023-11-15 (×2): qty 90, 90d supply, fill #0
  Filled 2024-02-07 – 2024-02-20 (×2): qty 90, 90d supply, fill #1

## 2023-09-03 MED ORDER — ATORVASTATIN CALCIUM 10 MG PO TABS
10.0000 mg | ORAL_TABLET | Freq: Every day | ORAL | 3 refills | Status: AC
  Filled 2023-09-03 – 2023-09-28 (×2): qty 90, 90d supply, fill #0
  Filled 2023-12-27: qty 90, 90d supply, fill #1

## 2023-09-03 NOTE — Progress Notes (Signed)
 Assessment & Plan:  Tyrone Nielsen was seen today for annual exam.  Diagnoses and all orders for this visit:  Encounter for annual physical exam -     CMP14+EGFR -     CBC with Differential  Essential hypertension -     amLODipine  (NORVASC ) 10 MG tablet; Take 1 tablet (10 mg total) by mouth daily.  Mixed hyperlipidemia -     atorvastatin  (LIPITOR) 10 MG tablet; Take 1 tablet (10 mg total) by mouth daily. -     Lipid panel  Bilateral impacted cerumen -     carbamide peroxide (DEBROX) 6.5 % OTIC solution; Place 5 drops into both ears 2 (two) times daily.  Irregular heart rhythm -     EKG 12-Lead  Unilateral hand tremors and short term memory loss/brain fog Recommend speak with psychiatry. If symptoms do not seem to be related to antipsychotics will need to refer to neurology    Patient has been counseled on age-appropriate routine health concerns for screening and prevention. These are reviewed and up-to-date. Referrals have been placed accordingly. Immunizations are up-to-date or declined.    Subjective:   Chief Complaint  Patient presents with   Annual Exam    Tyrone Nielsen 68 y.o. male presents to office today for annual physical exam.   Tremor: Tremor primarily involves the left hand.  Onset of symptoms was gradual, starting about several months ago. Symptoms are currently of mild and intermittent severity. Tremor exacerbated placing arm on objects. Tremor is alleviated by nothing. Symptoms occur intermittently and last hours. He denies bilateral hand tremor, difficulty with swallowing, and balance problems.  He does experience frequent memory loss as well.    EKG performed today as he is currently taking Abilify .  No QTc prolongation evident on EKG  Review of Systems  Constitutional:  Negative for fever, malaise/fatigue and weight loss.  HENT: Negative.  Negative for nosebleeds.   Eyes: Negative.  Negative for blurred vision, double vision and photophobia.   Respiratory: Negative.  Negative for cough and shortness of breath.   Cardiovascular: Negative.  Negative for chest pain, palpitations and leg swelling.  Gastrointestinal: Negative.  Negative for heartburn, nausea and vomiting.  Genitourinary: Negative.   Musculoskeletal: Negative.  Negative for myalgias.  Skin: Negative.   Neurological:  Positive for tremors. Negative for dizziness, focal weakness, seizures and headaches.  Endo/Heme/Allergies: Negative.   Psychiatric/Behavioral: Negative.  Negative for suicidal ideas.     Past Medical History:  Diagnosis Date   Anxiety    Depression    Hepatitis C    Hypertension    Substance abuse (HCC)     Past Surgical History:  Procedure Laterality Date   APPENDECTOMY      Family History  Problem Relation Age of Onset   Hypertension Neg Hx    Intellectual disability Neg Hx    Colon cancer Neg Hx    Esophageal cancer Neg Hx    Stomach cancer Neg Hx    Rectal cancer Neg Hx     Social History Reviewed with no changes to be made today.   Outpatient Medications Prior to Visit  Medication Sig Dispense Refill   acetaminophen  (TYLENOL ) 500 MG tablet Take 500 mg by mouth every 6 (six) hours as needed for mild pain.     ARIPiprazole  (ABILIFY ) 10 MG tablet Take 1 tablet (10 mg total) by mouth daily. 30 tablet 1   buPROPion  (WELLBUTRIN  SR) 150 MG 12 hr tablet Take 1 tablet (150 mg total) by mouth  2 (two) times daily. 180 tablet 0   busPIRone  (BUSPAR ) 15 MG tablet Take 1 tablet (15 mg total) by mouth 3 (three) times daily. 90 tablet 1   Multiple Vitamin (MULTIVITAMIN WITH MINERALS) TABS tablet Take 1 tablet by mouth daily.     OVER THE COUNTER MEDICATION Cranberry, One tablet daily.     amLODipine  (NORVASC ) 10 MG tablet Take 1 tablet (10 mg total) by mouth daily. 90 tablet 1   atorvastatin  (LIPITOR) 10 MG tablet Take 1 tablet (10 mg total) by mouth daily. 90 tablet 3   No facility-administered medications prior to visit.    No Known  Allergies     Objective:    BP 120/76 (BP Location: Left Arm, Patient Position: Sitting, Cuff Size: Normal)   Pulse 71   Resp 19   Ht 5' 9 (1.753 m)   Wt 185 lb 6.4 oz (84.1 kg)   SpO2 100%   BMI 27.38 kg/m  Wt Readings from Last 3 Encounters:  09/03/23 185 lb 6.4 oz (84.1 kg)  06/17/23 190 lb (86.2 kg)  05/13/23 192 lb 6.4 oz (87.3 kg)    Physical Exam Constitutional:      Appearance: He is well-developed.  HENT:     Head: Normocephalic and atraumatic.     Right Ear: Hearing, tympanic membrane, ear canal and external ear normal.     Left Ear: Hearing, tympanic membrane, ear canal and external ear normal.     Nose: Nose normal. No mucosal edema or rhinorrhea.     Right Turbinates: Not enlarged.     Left Turbinates: Not enlarged.     Mouth/Throat:     Lips: Pink.     Mouth: Mucous membranes are moist.     Dentition: No gingival swelling, dental abscesses or gum lesions.     Pharynx: Uvula midline.     Tonsils: No tonsillar exudate. 1+ on the right. 1+ on the left.  Eyes:     General: Lids are normal. No scleral icterus.    Extraocular Movements: Extraocular movements intact.     Conjunctiva/sclera: Conjunctivae normal.     Pupils: Pupils are equal, round, and reactive to light.  Neck:     Thyroid : No thyromegaly.     Trachea: No tracheal deviation.  Cardiovascular:     Rate and Rhythm: Normal rate and regular rhythm.     Heart sounds: Normal heart sounds. No murmur heard.    No friction rub. No gallop.  Pulmonary:     Effort: Pulmonary effort is normal. No respiratory distress.     Breath sounds: Normal breath sounds. No wheezing or rales.  Chest:     Chest wall: No mass or tenderness.  Breasts:    Right: No inverted nipple, mass, nipple discharge, skin change or tenderness.     Left: No inverted nipple, mass, nipple discharge, skin change or tenderness.  Abdominal:     General: Bowel sounds are normal. There is no distension.     Palpations: Abdomen is soft.  There is no mass.     Tenderness: There is no abdominal tenderness. There is no guarding or rebound.  Musculoskeletal:        General: No tenderness or deformity. Normal range of motion.     Cervical back: Normal range of motion and neck supple.  Lymphadenopathy:     Cervical: No cervical adenopathy.  Skin:    General: Skin is warm and dry.     Capillary Refill: Capillary refill takes less  than 2 seconds.     Findings: No erythema.  Neurological:     Mental Status: He is alert and oriented to person, place, and time.     Cranial Nerves: No cranial nerve deficit.     Sensory: Sensation is intact.     Motor: Tremor (left hand) present. No abnormal muscle tone.     Coordination: Coordination is intact. Coordination normal.     Gait: Gait is intact.     Deep Tendon Reflexes: Reflexes normal.     Reflex Scores:      Patellar reflexes are 1+ on the right side and 1+ on the left side. Psychiatric:        Attention and Perception: Attention normal.        Mood and Affect: Mood normal.        Speech: Speech normal.        Behavior: Behavior normal.        Thought Content: Thought content normal.        Judgment: Judgment normal.          Patient has been counseled extensively about nutrition and exercise as well as the importance of adherence with medications and regular follow-up. The patient was given clear instructions to go to ER or return to medical center if symptoms don't improve, worsen or new problems develop. The patient verbalized understanding.   Follow-up: Return in about 4 months (around 01/03/2024).   Haze LELON Servant, FNP-BC [facility] [place], [place] [phone]   09/22/2023, 7:00 AM

## 2023-09-04 ENCOUNTER — Ambulatory Visit: Payer: Self-pay | Admitting: Nurse Practitioner

## 2023-09-04 LAB — CBC WITH DIFFERENTIAL/PLATELET
Basophils Absolute: 0.1 x10E3/uL (ref 0.0–0.2)
Basos: 1 %
EOS (ABSOLUTE): 0.2 x10E3/uL (ref 0.0–0.4)
Eos: 2 %
Hematocrit: 48.5 % (ref 37.5–51.0)
Hemoglobin: 16.3 g/dL (ref 13.0–17.7)
Immature Grans (Abs): 0 x10E3/uL (ref 0.0–0.1)
Immature Granulocytes: 0 %
Lymphocytes Absolute: 1.3 x10E3/uL (ref 0.7–3.1)
Lymphs: 14 %
MCH: 31.1 pg (ref 26.6–33.0)
MCHC: 33.6 g/dL (ref 31.5–35.7)
MCV: 93 fL (ref 79–97)
Monocytes Absolute: 1 x10E3/uL — ABNORMAL HIGH (ref 0.1–0.9)
Monocytes: 10 %
Neutrophils Absolute: 6.9 x10E3/uL (ref 1.4–7.0)
Neutrophils: 73 %
Platelets: 291 x10E3/uL (ref 150–450)
RBC: 5.24 x10E6/uL (ref 4.14–5.80)
RDW: 12.1 % (ref 11.6–15.4)
WBC: 9.5 x10E3/uL (ref 3.4–10.8)

## 2023-09-04 LAB — CMP14+EGFR
ALT: 25 IU/L (ref 0–44)
AST: 22 IU/L (ref 0–40)
Albumin: 4.9 g/dL (ref 3.9–4.9)
Alkaline Phosphatase: 114 IU/L (ref 44–121)
BUN/Creatinine Ratio: 12 (ref 10–24)
BUN: 12 mg/dL (ref 8–27)
Bilirubin Total: 0.5 mg/dL (ref 0.0–1.2)
CO2: 24 mmol/L (ref 20–29)
Calcium: 10 mg/dL (ref 8.6–10.2)
Chloride: 96 mmol/L (ref 96–106)
Creatinine, Ser: 0.99 mg/dL (ref 0.76–1.27)
Globulin, Total: 2.9 g/dL (ref 1.5–4.5)
Glucose: 74 mg/dL (ref 70–99)
Potassium: 4.4 mmol/L (ref 3.5–5.2)
Sodium: 135 mmol/L (ref 134–144)
Total Protein: 7.8 g/dL (ref 6.0–8.5)
eGFR: 83 mL/min/1.73 (ref >59)

## 2023-09-04 LAB — LIPID PANEL
Chol/HDL Ratio: 3.1 ratio (ref 0.0–5.0)
Cholesterol, Total: 153 mg/dL (ref 100–199)
HDL: 50 mg/dL (ref >39)
LDL Chol Calc (NIH): 82 mg/dL (ref 0–99)
Triglycerides: 118 mg/dL (ref 0–149)
VLDL Cholesterol Cal: 21 mg/dL (ref 5–40)

## 2023-09-08 ENCOUNTER — Other Ambulatory Visit: Payer: Self-pay

## 2023-09-12 ENCOUNTER — Encounter: Admitting: Nurse Practitioner

## 2023-09-22 ENCOUNTER — Encounter: Payer: Self-pay | Admitting: Nurse Practitioner

## 2023-09-28 ENCOUNTER — Other Ambulatory Visit: Payer: Self-pay

## 2023-09-29 ENCOUNTER — Encounter: Payer: Self-pay | Admitting: Pharmacist

## 2023-09-29 ENCOUNTER — Other Ambulatory Visit: Payer: Self-pay

## 2023-10-08 ENCOUNTER — Other Ambulatory Visit: Payer: Self-pay

## 2023-10-18 ENCOUNTER — Other Ambulatory Visit (HOSPITAL_COMMUNITY): Payer: Self-pay | Admitting: Psychiatry

## 2023-10-18 DIAGNOSIS — F411 Generalized anxiety disorder: Secondary | ICD-10-CM

## 2023-10-20 ENCOUNTER — Other Ambulatory Visit: Payer: Self-pay

## 2023-10-20 MED ORDER — BUSPIRONE HCL 15 MG PO TABS
15.0000 mg | ORAL_TABLET | Freq: Three times a day (TID) | ORAL | 1 refills | Status: DC
  Filled 2023-10-20: qty 90, 30d supply, fill #0
  Filled 2023-11-29: qty 90, 30d supply, fill #1

## 2023-10-21 ENCOUNTER — Other Ambulatory Visit: Payer: Self-pay

## 2023-11-01 ENCOUNTER — Other Ambulatory Visit (HOSPITAL_COMMUNITY): Payer: Self-pay | Admitting: Psychiatry

## 2023-11-03 ENCOUNTER — Other Ambulatory Visit: Payer: Self-pay

## 2023-11-03 MED ORDER — ARIPIPRAZOLE 10 MG PO TABS
10.0000 mg | ORAL_TABLET | Freq: Every day | ORAL | 1 refills | Status: DC
  Filled 2023-11-03: qty 30, 30d supply, fill #0
  Filled 2023-11-29: qty 30, 30d supply, fill #1

## 2023-11-04 ENCOUNTER — Other Ambulatory Visit: Payer: Self-pay

## 2023-11-15 ENCOUNTER — Other Ambulatory Visit: Payer: Self-pay

## 2023-11-19 ENCOUNTER — Other Ambulatory Visit: Payer: Self-pay

## 2023-11-29 ENCOUNTER — Other Ambulatory Visit (HOSPITAL_COMMUNITY): Payer: Self-pay | Admitting: Psychiatry

## 2023-11-29 DIAGNOSIS — F3340 Major depressive disorder, recurrent, in remission, unspecified: Secondary | ICD-10-CM

## 2023-11-30 MED ORDER — BUPROPION HCL ER (SR) 150 MG PO TB12
150.0000 mg | ORAL_TABLET | Freq: Two times a day (BID) | ORAL | 0 refills | Status: AC
  Filled 2023-11-30: qty 180, 90d supply, fill #0

## 2023-12-01 ENCOUNTER — Other Ambulatory Visit: Payer: Self-pay

## 2023-12-27 ENCOUNTER — Other Ambulatory Visit (HOSPITAL_COMMUNITY): Payer: Self-pay | Admitting: Psychiatry

## 2023-12-29 ENCOUNTER — Other Ambulatory Visit: Payer: Self-pay

## 2023-12-29 MED ORDER — ARIPIPRAZOLE 10 MG PO TABS
10.0000 mg | ORAL_TABLET | Freq: Every day | ORAL | 1 refills | Status: AC
  Filled 2023-12-29: qty 30, 30d supply, fill #0
  Filled 2024-01-31: qty 30, 30d supply, fill #1

## 2024-01-01 ENCOUNTER — Other Ambulatory Visit: Payer: Self-pay

## 2024-01-01 ENCOUNTER — Ambulatory Visit (HOSPITAL_COMMUNITY): Admitting: Psychiatry

## 2024-01-01 ENCOUNTER — Encounter (HOSPITAL_COMMUNITY): Payer: Self-pay | Admitting: Psychiatry

## 2024-01-01 DIAGNOSIS — F3340 Major depressive disorder, recurrent, in remission, unspecified: Secondary | ICD-10-CM

## 2024-01-01 DIAGNOSIS — F411 Generalized anxiety disorder: Secondary | ICD-10-CM

## 2024-01-01 DIAGNOSIS — F1121 Opioid dependence, in remission: Secondary | ICD-10-CM | POA: Diagnosis not present

## 2024-01-01 MED ORDER — BUSPIRONE HCL 15 MG PO TABS
15.0000 mg | ORAL_TABLET | Freq: Two times a day (BID) | ORAL | 1 refills | Status: AC
  Filled 2024-01-01: qty 60, 30d supply, fill #0
  Filled 2024-02-15: qty 60, 30d supply, fill #1

## 2024-01-01 NOTE — Progress Notes (Signed)
 [facility] Follow up visit  Patient Identification: Tyrone Nielsen MRN:  [id number] Date of Evaluation:  01/01/2024 Referral Source: [facility] Chief Complaint:  follow up,  anxiety  Visit Diagnosis:    ICD-10-CM   1. Recurrent major depressive disorder, in remission  F33.40     2. Generalized anxiety disorder  F41.1 busPIRone  (BUSPAR ) 15 MG tablet    3. Opioid dependence in remission [facility])  F11.21        History of Present Illness:   Patient turned 68 and had to be transferred from [facility] since not living in [place] Patient has been diagnosed with depression anxiety and alcohol dependence in remission referred to establish care  On eval mood is fair, tolerating meds  Some tremors on one hand right hand not on left. No involuntary movements Doesn't want to lower abilify  can connect with neurology to evaluate  Takes care of mom  Gets labs done by PCP ,  Lipids reviewed and are normal. On cholesterol meds EKG done by PCP report reviewed and states QTc interval normal   Denies chest pain  Aggravating factors; difficult childhood, daughter stress at times Modifying factors; goes to church, mom  Severity ; manageable    Duration : adult life . Sobreity of alcohol, cocaine and opiods more then 20 years   Past Psychiatric History: depression, anxiety, alcohol use  Previous Psychotropic Medications: Yes   Substance Abuse History in the last 12 months:  No.  Consequences of Substance Abuse: NA  Past Medical History:  Past Medical History:  Diagnosis Date   Anxiety    Depression    Hepatitis C    Hypertension    Substance abuse (HCC)     Past Surgical History:  Procedure Laterality Date   APPENDECTOMY      Family Psychiatric History: nephew, schizoaffective disorder  Family History:  Family History  Problem Relation Age of Onset   Hypertension Neg Hx    Intellectual disability Neg Hx    Colon cancer Neg Hx    Esophageal cancer Neg Hx    Stomach cancer Neg Hx     Rectal cancer Neg Hx     Social History:   Social History   Socioeconomic History   Marital status: Divorced    Spouse name: Not on file   Number of children: 1   Years of education: 12   Highest education level: Not on file  Occupational History   Occupation: Retired    Comment: Contstruction  Tobacco Use   Smoking status: Former   Smokeless tobacco: Never   Tobacco comments:    Quit smoking 7-8 years. 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   Drug use: Not Currently   Sexual activity: Not Currently  Other Topics Concern   Not on file  Social History Narrative   Works part time in holiday representative    Social Drivers of Health   Tobacco Use: Medium Risk (01/01/2024)   Patient History    Smoking Tobacco Use: Former    Smokeless Tobacco Use: Never    Passive Exposure: Not on file  Financial Resource Strain: Low Risk (06/17/2023)   Overall Financial Resource Strain (CARDIA)    Difficulty of Paying Living Expenses: Not hard at all  Food Insecurity: No Food Insecurity (06/17/2023)   Hunger Vital Sign    Worried  About Running Out of Food in the Last Year: Never true    Ran Out of Food in the Last Year: Never true  Transportation Needs: No Transportation Needs (06/17/2023)   PRAPARE - Administrator, Civil Service (Medical): No    Lack of Transportation (Non-Medical): No  Physical Activity: Sufficiently Active (06/17/2023)   Exercise Vital Sign    Days of Exercise per Week: 7 days    Minutes of Exercise per Session: 50 min  Stress: No Stress Concern Present (06/17/2023)   [employer] of Occupational Health - Occupational Stress Questionnaire    Feeling of Stress : Not at all  Social Connections: Moderately Integrated (06/17/2023)   Social Connection and Isolation Panel    Frequency of Communication with Friends and  Family: More than three times a week    Frequency of Social Gatherings with Friends and Family: Three times a week    Attends Religious Services: More than 4 times per year    Active Member of Clubs or Organizations: Yes    Attends Banker Meetings: 1 to 4 times per year    Marital Status: Divorced  Depression (PHQ2-9): Low Risk (09/03/2023)   Depression (PHQ2-9)    PHQ-2 Score: 4  Alcohol Screen: Low Risk (06/17/2023)   Alcohol Screen    Last Alcohol Screening Score (AUDIT): 0  Housing: Low Risk (06/17/2023)   Housing Stability Vital Sign    Unable to Pay for Housing in the Last Year: No    Number of Times Moved in the Last Year: 0    Homeless in the Last Year: No  Utilities: Not At Risk (06/17/2023)   AHC Utilities    Threatened with loss of utilities: No  Health Literacy: Adequate Health Literacy (06/17/2023)   B1300 Health Literacy    Frequency of need for help with medical instructions: Never     Allergies:  No Known Allergies  Metabolic Disorder Labs: Lab Results  Component Value Date   HGBA1C 5.3 12/15/2019   No results found for: PROLACTIN Lab Results  Component Value Date   CHOL 153 09/03/2023   TRIG 118 09/03/2023   HDL 50 09/03/2023   CHOLHDL 3.1 09/03/2023   LDLCALC 82 09/03/2023   LDLCALC 66 05/13/2023   Lab Results  Component Value Date   TSH 2.150 12/21/2020    Therapeutic Level Labs: No results found for: LITHIUM No results found for: CBMZ No results found for: VALPROATE  Current Medications: Current Outpatient Medications  Medication Sig Dispense Refill   acetaminophen  (TYLENOL ) 500 MG tablet Take 500 mg by mouth every 6 (six) hours as needed for mild pain.     amLODipine  (NORVASC ) 10 MG tablet Take 1 tablet (10 mg total) by mouth daily. 90 tablet 1   ARIPiprazole  (ABILIFY ) 10 MG tablet Take 1 tablet (10 mg total) by mouth daily. 30 tablet 1   atorvastatin  (LIPITOR) 10 MG tablet Take 1 tablet (10 mg total) by mouth daily. 90  tablet 3   buPROPion  (WELLBUTRIN  SR) 150 MG 12 hr tablet Take 1 tablet (150 mg total) by mouth 2 (two) times daily. 180 tablet 0   busPIRone  (BUSPAR ) 15 MG tablet Take 1 tablet (15 mg total) by mouth 2 (two) times daily. 60 tablet 1   carbamide peroxide (DEBROX) 6.5 % OTIC solution Place 5 drops into both ears 2 (two) times daily. 15 mL 0   Multiple Vitamin (MULTIVITAMIN WITH MINERALS) TABS tablet Take 1 tablet by mouth daily.  OVER THE COUNTER MEDICATION Cranberry, One tablet daily.     No current facility-administered medications for this visit.     Psychiatric Specialty Exam: Review of Systems  Cardiovascular:  Negative for chest pain.  Neurological:  Positive for tremors.  Psychiatric/Behavioral:  Negative for agitation and dysphoric mood.     There were no vitals taken for this visit.There is no height or weight on file to calculate BMI.  General Appearance: Casual  Eye Contact:  Fair  Speech:  Clear and Coherent  Volume:  Decreased  Mood: fair   Affect:  Congruent  Thought Process:  Goal Directed  Orientation:  Full (Time, Place, and Person)  Thought Content:  Rumination  Suicidal Thoughts:  No  Homicidal Thoughts:  No  Memory:  Immediate;   Fair  Judgement:  Fair  Insight:  Fair  Psychomotor Activity:  Normal  Concentration:  Concentration: Fair  Recall:  Good  Fund of Knowledge:Good  Language: Good  Akathisia:  No  Handed:    AIMS (if indicated):  no involuntary movements  Assets:  Architect Housing Physical Health  ADL's:  Intact  Cognition: WNL  Sleep:  Fair   Screenings: GAD-7    Garment/textile Technologist Visit from 09/03/2023 in [facility] - A Dept Of [place]. [facility] Office Visit from 05/13/2023 in [facility] - A Dept Of [place] DEL. [facility] Office Visit from 09/10/2022 in [facility] - A Dept Of [place] DEL. [facility]  Office Visit from 02/20/2022 in [facility] [place] - A Dept Of [place] DEL. [facility] Office Visit from 08/17/2021 in [facility] [place] - A Dept Of [place] DEL. [facility]  Total GAD-7 Score 9 5 4 6  0   PHQ2-9    Flowsheet Row Office Visit from 09/03/2023 in [facility] [place] - A Dept Of [place]. [facility] Clinical Support from 06/17/2023 in [facility] - A Dept Of [place]. [facility] Office Visit from 05/13/2023 in [facility] [place] - A Dept Of [place] DEL. [facility] Office Visit from 09/10/2022 in [facility] [place] - A Dept Of [place] DEL. [facility] Clinical Support from 06/11/2022 in [facility] [place] - A Dept Of [place] DEL. [facility]  PHQ-2 Total Score 2 1 1 2  0  PHQ-9 Total Score 4 2 3 6  --   Flowsheet Row Video Visit from 08/31/2021 in [facility] at [facility] Office Visit from 06/05/2021 in [facility] Outpatient Behavioral Health at [facility] Office Visit from 02/27/2021 in [facility] at [facility]  C-SSRS RISK CATEGORY No Risk No Risk No Risk    Assessment and Plan: as follows  Prior documentation reviewed  MDD recurrent moderate: in remission continue wellbutrin , abilify , he doesn't want to lower it Lipids normal,EKG normal and  QTc reported normal   HJI:fjwjhzjaoz on buspar  bid not tid now   Opiod and cocaine use:  remains sober, continue monitoring and relapse prevention discussed  Direct care time spent 18 minutes  FU 4 - 6 m  Jackey Flight, MD 12/18/202510:28 AM

## 2024-01-02 ENCOUNTER — Other Ambulatory Visit: Payer: Self-pay

## 2024-01-06 ENCOUNTER — Ambulatory Visit: Attending: Nurse Practitioner | Admitting: Nurse Practitioner

## 2024-01-06 ENCOUNTER — Encounter: Payer: Self-pay | Admitting: Nurse Practitioner

## 2024-01-06 VITALS — BP 110/69 | HR 59 | Ht 69.0 in | Wt 187.0 lb

## 2024-01-06 DIAGNOSIS — I10 Essential (primary) hypertension: Secondary | ICD-10-CM

## 2024-01-06 DIAGNOSIS — G252 Other specified forms of tremor: Secondary | ICD-10-CM

## 2024-01-06 NOTE — Progress Notes (Signed)
 "  Assessment & Plan:  Tyrone Nielsen was seen today for hypertension.  Diagnoses and all orders for this visit:  Primary hypertension Continue all antihypertensives as prescribed.  Reminded to bring in blood pressure log for follow  up appointment.  RECOMMENDATIONS: DASH/Mediterranean Diets are healthier choices for HTN.    Coarse tremors -     Ambulatory referral to Neurology Intermittent right hand tremor, more pronounced at rest. Psychiatry does not recommend medication adjustment of Abilify  at this time.   Neurology referral recommended. - Referred to neurology for further evaluation.    Patient has been counseled on age-appropriate routine health concerns for screening and prevention. These are reviewed and up-to-date. Referrals have been placed accordingly. Immunizations are up-to-date or declined.    Subjective:   Chief Complaint  Patient presents with   Hypertension  History of Present Illness  Tyrone Nielsen is a 68 year old male who presents for follow up to HTN and with chronic left hand tremor.   He has a past medical history of Anxiety, Depression, Hepatitis C, Hypertension, and PAST Substance abuse  Followed by psychiatry.  He uses Debrox ear drops consistently which have been helping with excessive ear wax accumulation.   HTN Blood pressure is well controlled with amlodipine  10 mg daily BP Readings from Last 3 Encounters:  01/06/24 110/69  09/03/23 120/76  05/13/23 111/70    Tremor: Tremor primarily involves the left hand.  Onset of symptoms was gradual, starting about several months ago. Symptoms are currently of mild and intermittent severity. Tremor exacerbated placing arm on objects. Tremor is alleviated by nothing. Symptoms occur intermittently and last hours. He denies bilateral hand tremor, difficulty with swallowing, and balance problems.  He does experience frequent memory loss as well.    Review of Systems  Constitutional:  Negative for fever,  malaise/fatigue and weight loss.  HENT: Negative.  Negative for nosebleeds.   Eyes: Negative.  Negative for blurred vision, double vision and photophobia.  Respiratory: Negative.  Negative for cough and shortness of breath.   Cardiovascular: Negative.  Negative for chest pain, palpitations and leg swelling.  Gastrointestinal: Negative.  Negative for heartburn, nausea and vomiting.  Musculoskeletal: Negative.  Negative for myalgias.  Neurological:  Positive for tremors. Negative for dizziness, tingling, focal weakness, seizures and headaches.  Psychiatric/Behavioral:  Positive for depression. Negative for suicidal ideas. The patient is nervous/anxious.     Past Medical History:  Diagnosis Date   Anxiety    Depression    Hepatitis C    Hypertension    Substance abuse (HCC)     Past Surgical History:  Procedure Laterality Date   APPENDECTOMY      Family History  Problem Relation Age of Onset   Hypertension Neg Hx    Intellectual disability Neg Hx    Colon cancer Neg Hx    Esophageal cancer Neg Hx    Stomach cancer Neg Hx    Rectal cancer Neg Hx     Social History Reviewed with no changes to be made today.   Outpatient Medications Prior to Visit  Medication Sig Dispense Refill   acetaminophen  (TYLENOL ) 500 MG tablet Take 500 mg by mouth every 6 (six) hours as needed for mild pain.     amLODipine  (NORVASC ) 10 MG tablet Take 1 tablet (10 mg total) by mouth daily. 90 tablet 1   ARIPiprazole  (ABILIFY ) 10 MG tablet Take 1 tablet (10 mg total) by mouth daily. 30 tablet 1   atorvastatin  (LIPITOR) 10 MG tablet  Take 1 tablet (10 mg total) by mouth daily. 90 tablet 3   buPROPion  (WELLBUTRIN  SR) 150 MG 12 hr tablet Take 1 tablet (150 mg total) by mouth 2 (two) times daily. 180 tablet 0   busPIRone  (BUSPAR ) 15 MG tablet Take 1 tablet (15 mg total) by mouth 2 (two) times daily. 60 tablet 1   Multiple Vitamin (MULTIVITAMIN WITH MINERALS) TABS tablet Take 1 tablet by mouth daily.     OVER  THE COUNTER MEDICATION Cranberry, One tablet daily.     carbamide peroxide (DEBROX) 6.5 % OTIC solution Place 5 drops into both ears 2 (two) times daily. (Patient not taking: Reported on 01/06/2024) 15 mL 0   No facility-administered medications prior to visit.    Allergies[1]     Objective:    BP 110/69 (BP Location: Left Arm, Patient Position: Sitting, Cuff Size: Normal)   Pulse (!) 59   Ht 5' 9 (1.753 m)   Wt 187 lb (84.8 kg)   SpO2 98%   BMI 27.62 kg/m  Wt Readings from Last 3 Encounters:  01/06/24 187 lb (84.8 kg)  09/03/23 185 lb 6.4 oz (84.1 kg)  06/17/23 190 lb (86.2 kg)    Physical Exam Vitals and nursing note reviewed.  Constitutional:      Appearance: He is well-developed.  HENT:     Head: Normocephalic and atraumatic.  Cardiovascular:     Rate and Rhythm: Normal rate and regular rhythm.     Heart sounds: Normal heart sounds. No murmur heard.    No friction rub. No gallop.  Pulmonary:     Effort: Pulmonary effort is normal. No tachypnea or respiratory distress.     Breath sounds: Normal breath sounds. No decreased breath sounds, wheezing, rhonchi or rales.  Chest:     Chest wall: No tenderness.  Musculoskeletal:        General: Normal range of motion.     Cervical back: Normal range of motion.  Skin:    General: Skin is warm and dry.  Neurological:     Mental Status: He is alert and oriented to person, place, and time.     Coordination: Coordination normal.  Psychiatric:        Behavior: Behavior normal. Behavior is cooperative.        Thought Content: Thought content normal.        Judgment: Judgment normal.          Patient has been counseled extensively about nutrition and exercise as well as the importance of adherence with medications and regular follow-up. The patient was given clear instructions to go to ER or return to medical center if symptoms don't improve, worsen or new problems develop. The patient verbalized understanding.    Follow-up: Return in about 3 months (around 04/16/2024).   Haze LELON Servant, FNP-BC [facility] [place], [place] [phone]   01/06/2024, 11:25 AM     [1] No Known Allergies  "

## 2024-01-20 ENCOUNTER — Encounter: Payer: Self-pay | Admitting: Neurology

## 2024-02-03 ENCOUNTER — Other Ambulatory Visit: Payer: Self-pay

## 2024-02-20 ENCOUNTER — Other Ambulatory Visit: Payer: Self-pay

## 2024-04-13 ENCOUNTER — Ambulatory Visit: Payer: Self-pay | Admitting: Neurology

## 2024-04-21 ENCOUNTER — Ambulatory Visit: Payer: Self-pay | Admitting: Nurse Practitioner

## 2024-04-29 ENCOUNTER — Ambulatory Visit (HOSPITAL_COMMUNITY): Admitting: Psychiatry

## 2024-06-22 ENCOUNTER — Ambulatory Visit: Payer: Self-pay

## 2024-06-22 ENCOUNTER — Ambulatory Visit
